# Patient Record
Sex: Female | Born: 1965 | Race: White | Hispanic: No | Smoking: Never smoker
Health system: Southern US, Community
[De-identification: ages and names within clinical notes are randomized; demographics above are authoritative.]

## PROBLEM LIST (undated history)

## (undated) DIAGNOSIS — B001 Herpesviral vesicular dermatitis: Secondary | ICD-10-CM

## (undated) DIAGNOSIS — R197 Diarrhea, unspecified: Secondary | ICD-10-CM

## (undated) DIAGNOSIS — J329 Chronic sinusitis, unspecified: Secondary | ICD-10-CM

## (undated) DIAGNOSIS — Z8489 Family history of other specified conditions: Secondary | ICD-10-CM

## (undated) DIAGNOSIS — R5383 Other fatigue: Secondary | ICD-10-CM

## (undated) DIAGNOSIS — R011 Cardiac murmur, unspecified: Secondary | ICD-10-CM

## (undated) DIAGNOSIS — R519 Headache, unspecified: Secondary | ICD-10-CM

## (undated) DIAGNOSIS — R5381 Other malaise: Secondary | ICD-10-CM

## (undated) DIAGNOSIS — R931 Abnormal findings on diagnostic imaging of heart and coronary circulation: Secondary | ICD-10-CM

## (undated) DIAGNOSIS — I351 Nonrheumatic aortic (valve) insufficiency: Secondary | ICD-10-CM

## (undated) DIAGNOSIS — J019 Acute sinusitis, unspecified: Secondary | ICD-10-CM

## (undated) DIAGNOSIS — I341 Nonrheumatic mitral (valve) prolapse: Secondary | ICD-10-CM

## (undated) DIAGNOSIS — L309 Dermatitis, unspecified: Secondary | ICD-10-CM

## (undated) HISTORY — DX: Diarrhea, unspecified: R19.7

## (undated) HISTORY — DX: Herpesviral vesicular dermatitis: B00.1

## (undated) HISTORY — DX: Cardiac murmur, unspecified: R01.1

## (undated) HISTORY — DX: Chronic sinusitis, unspecified: J32.9

## (undated) HISTORY — DX: Other malaise: R53.81

## (undated) HISTORY — DX: Headache, unspecified: R51.9

## (undated) HISTORY — DX: Other fatigue: R53.83

## (undated) HISTORY — DX: Nonrheumatic mitral (valve) prolapse: I34.1

## (undated) HISTORY — DX: Dermatitis, unspecified: L30.9

## (undated) HISTORY — DX: Acute sinusitis, unspecified: J01.90

---
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
## 2020-06-22 ENCOUNTER — Telehealth: Payer: Self-pay

## 2020-06-22 ENCOUNTER — Ambulatory Visit
Admission: RE | Admit: 2020-06-22 | Discharge: 2020-06-22 | Disposition: A | Discharge status: Home / Self Care | Payer: Self-pay | Source: Ambulatory Visit | Attending: Cardiovascular Disease | Admitting: Cardiovascular Disease

## 2020-06-22 DIAGNOSIS — I340 Nonrheumatic mitral (valve) insufficiency: Secondary | ICD-10-CM

## 2020-06-22 NOTE — Telephone Encounter (Signed)
Per Dr. [name] and Dr. Cornelius Moras, called patient to discuss MR consult. [place] and would like consults and CTs done 12/15 if possible. She understands this will try to be arranged and she will be called early next week to confirm.   Will request records ASAP from: Dr. Moise Boring [facility]) Dr. Delphina Cahill [facility])

## 2020-06-26 NOTE — Telephone Encounter (Signed)
Spoke with patient and confirmed she will have appointments on 12/15. 0900: Consult with Dr. [name] 1045: CT scans 1600: Consult with Dr. Cornelius Moras  MyChart code sent to patient and will send message with appointment/CT information when she signs up. She was grateful for call and agrees with plan.

## 2020-07-03 ENCOUNTER — Telehealth (HOSPITAL_COMMUNITY): Payer: Self-pay | Admitting: Emergency Medicine

## 2020-07-03 NOTE — Telephone Encounter (Signed)
Reaching out to patient to offer assistance regarding upcoming cardiac imaging study; pt verbalizes understanding of appt date/time, parking situation and where to check in, pre-test NPO status and medications ordered, and verified current allergies; name and call back number provided for further questions should they arise Alexa Alexandria RN Navigator Cardiac Imaging [name] [phone] office [phone] cell   Pt will be seeing Alexa Pennington in [facility] [address] office at 9a; Alexa Pennington to give metop in office if HR >55bpm.  Then patient seeing Alexa Pennington in [facility] office at 4pm.   Alexa Pennington

## 2020-07-05 ENCOUNTER — Other Ambulatory Visit: Payer: Self-pay | Admitting: *Deleted

## 2020-07-05 ENCOUNTER — Institutional Professional Consult (permissible substitution): Payer: Federal, State, Local not specified - PPO | Admitting: Thoracic Surgery (Cardiothoracic Vascular Surgery)

## 2020-07-05 ENCOUNTER — Other Ambulatory Visit: Payer: Self-pay

## 2020-07-05 ENCOUNTER — Ambulatory Visit: Payer: Federal, State, Local not specified - PPO | Admitting: Cardiovascular Disease

## 2020-07-05 ENCOUNTER — Encounter: Payer: Self-pay | Admitting: Thoracic Surgery (Cardiothoracic Vascular Surgery)

## 2020-07-05 ENCOUNTER — Ambulatory Visit (HOSPITAL_COMMUNITY)
Admission: RE | Admit: 2020-07-05 | Discharge: 2020-07-05 | Disposition: A | Discharge status: Home / Self Care | Payer: Federal, State, Local not specified - PPO | Source: Ambulatory Visit | Attending: Cardiovascular Disease | Admitting: Cardiovascular Disease

## 2020-07-05 ENCOUNTER — Encounter: Payer: Self-pay | Admitting: Cardiovascular Disease

## 2020-07-05 VITALS — BP 110/76 | HR 75 | Ht 67.0 in | Wt 168.2 lb

## 2020-07-05 VITALS — BP 114/74 | HR 70 | Resp 18 | Ht 67.0 in | Wt 168.0 lb

## 2020-07-05 DIAGNOSIS — I340 Nonrheumatic mitral (valve) insufficiency: Secondary | ICD-10-CM

## 2020-07-05 MED ORDER — NITROGLYCERIN 0.4 MG SL SUBL
0.8000 mg | SUBLINGUAL_TABLET | Freq: Once | SUBLINGUAL | Status: AC
  Administered 2020-07-05: 0.8 mg via SUBLINGUAL

## 2020-07-05 MED ORDER — NITROGLYCERIN 0.4 MG SL SUBL
SUBLINGUAL_TABLET | SUBLINGUAL | Status: AC
  Filled 2020-07-05: qty 2

## 2020-07-05 MED ORDER — METOPROLOL TARTRATE 12.5 MG HALF TABLET: 100.0000 mg | ORAL_TABLET | Freq: Once | ORAL | Status: DC

## 2020-07-05 MED ORDER — IOHEXOL 350 MG/ML SOLN
100.0000 mL | Freq: Once | INTRAVENOUS | Status: AC | PRN
  Administered 2020-07-05: 100 mL via INTRAVENOUS

## 2020-07-05 NOTE — Patient Instructions (Signed)
Continue all previous medications without any changes at this time  Make sure to bring all of your medications with you when you come for your Pre-Admission Testing appointment at [facility] Short-Stay Department.  Have nothing to eat or drink after midnight the night before surgery.  On the morning of surgery do not take any medications.   At your appointment for Pre-Admission Testing at the [facility] Short-Stay Department you will be asked to sign permission forms for your upcoming surgery.  By definition your signature on these forms implies that you and/or your designee provide full informed consent for your planned surgical procedure(s), that alternative treatment options have been discussed, that you understand and accept any and all potential risks, and that you have some understanding of what to expect for your post-operative convalescence.  For any major cardiac surgical procedure potential operative risks include but are not limited to at least some risk of death, stroke or other neurologic complication, myocardial infarction, congestive heart failure, respiratory failure, renal failure, bleeding requiring blood transfusion and/or reexploration, irregular heart rhythm, heart block or bradycardia requiring permanent pacemaker, aortic dissection or other major vascular complication, pneumonia, pericardial effusion, pleural effusion, wound infection, pulmonary embolus or other thromboembolic complication, chronic pain, or other complications related to the specific procedure(s) performed.  Please call to schedule a follow-up appointment in our office prior to surgery if you have any unresolved questions about your planned surgical procedure, the associated risks, alternative treatment options, and/or expectations for your post-operative recovery.

## 2020-07-05 NOTE — Progress Notes (Signed)
[address]       [place] [address]             [place] REPORT  Referring Provider is Sherren Mocha, MD Primary Cardiologist is Velta Addison, Annie Main, MD Primary Care Physician is Paulita Fujita, MD  Chief Complaint  Patient presents with  . Mitral Regurgitation    Initial surgical consult, Cta today    HPI:  Patient is 54 year old female with longstanding history of mitral valve prolapse and mitral regurgitation who has been referred for surgical consultation.  Patient states that she was first told that she had a heart murmur by her pediatrician when she was in junior high school.  Several female family members have had the diagnosis of mitral valve prolapse as well.  She initially had several serial echocardiograms studies as well as Holter monitor for evaluation of palpitations.  She remained clinically stable and went for nearly 20 years without further formal cardiac follow-up.  More recently she has been followed with routine echocardiography.  Transthoracic echocardiograms have reportedly demonstrated the presence of normal left ventricular function with moderately severe and severe mitral regurgitation.  She was seen in follow-up by her cardiologist in [place] and underwent transesophageal echocardiogram which demonstrated myxomatous degenerative disease of the mitral valve with a large flail segment of the posterior leaflet and obvious ruptured chordae tendinae.  There was severe mitral regurgitation and normal left ventricular systolic function.  The patient discussed these findings with her cousin, Dr. Darlina Guys, and her transesophageal echocardiogram was forwarded to our multidisciplinary heart valve team for review.  The patient was seen in consultation by Dr. Burt Knack earlier today and subsequently underwent coronary CT angiography and was referred for surgical consultation.  Patient is  divorced and lives alone in [place], [place].  She has 2 adult children 1 of whom just graduated from [employer] and the other remains in college and runs track at [employer].  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  This primarily involves working from home.  The patient has remained physically active and entirely functionally independent throughout her adult life.  A year and a half ago she sustained an injury to her knee and that limited how much exercise she had been getting.  However, more recently she has been back walking on a regular basis.  She denies any significant physical limitations although she states that she "feels out of shape".  She does note that if she walks up a flight of stairs she seems to be a little bit short of breath by the time she gets to the top step, but this is mild and does not seem to limit her at all.  She otherwise has no symptoms of exertional shortness of breath or chest discomfort.  She has never had resting shortness of breath, PND, orthopnea, or lower extremity edema.  She states that she has had some palpitations in the past but she is no longer aware of them.  She has never had any dizziness or syncope.  Past Medical History:  Diagnosis Date  . Acute sinusitis   . Dermatitis   . Diarrhea   . Facial pain   . Fever blister   . Headache   . Heart murmur   . Malaise and fatigue   . Mitral valve prolapse   . Sinus infection     No past  surgical history on file.  Family History  Problem Relation Age of Onset  . CAD Neg Hx     Social History   Socioeconomic History  . Marital status: Divorced    Spouse name: Not on file  . Number of children: Not on file  . Years of education: Not on file  . Highest education level: Not on file  Occupational History  . Not on file  Tobacco Use  . Smoking status: Never Smoker  . Smokeless tobacco: Never Used  Substance and Sexual  Activity  . Alcohol use: Yes    Comment: social  . Drug use: Never  . Sexual activity: Not on file  Other Topics Concern  . Not on file  Social History Narrative  . Not on file   Social Determinants of Health   Financial Resource Strain: Not on file  Food Insecurity: Not on file  Transportation Needs: Not on file  Physical Activity: Not on file  Stress: Not on file  Social Connections: Not on file  Intimate Partner Violence: Not on file    Current Outpatient Medications  Medication Sig Dispense Refill  . levonorgestrel-ethinyl estradiol (NORDETTE) 0.15-30 MG-MCG tablet Take 1 tablet by mouth daily.    Marland Kitchen acyclovir (ZOVIRAX) 400 MG tablet Take 400 mg by mouth 5 (five) times daily. (Patient not taking: Reported on 07/05/2020)    . butalbital-acetaminophen-caffeine (FIORICET) 50-325-40 MG tablet Take by mouth 2 (two) times daily as needed for headache. (Patient not taking: Reported on 07/05/2020)    . cetirizine (ZYRTEC) 10 MG tablet Take 10 mg by mouth daily. (Patient not taking: Reported on 07/05/2020)    . ciclopirox (LOPROX) 0.77 % cream Apply topically 2 (two) times daily. (Patient not taking: Reported on 07/05/2020)     Current Facility-Administered Medications  Medication Dose Route Frequency Provider Last Rate Last Admin  . metoprolol tartrate (LOPRESSOR) tablet 100 mg  100 mg Oral Once Sherren Mocha, MD        Allergies  Allergen Reactions  . Levaquin [Levofloxacin]       Review of Systems:   General:  normal appetite, normal energy, no weight gain, no weight loss, no fever  Cardiac:  no chest pain with exertion, no chest pain at rest, possibly mild SOB with more strenuous exertion, no resting SOB, no PND, no orthopnea, no palpitations, no arrhythmia, no atrial fibrillation, no LE edema, no dizzy spells, no syncope  Respiratory:  no shortness of breath, no home oxygen, no productive cough, no dry cough, no bronchitis, no wheezing, no hemoptysis, no asthma, no pain  with inspiration or cough, no sleep apnea, no CPAP at night  GI:   no difficulty swallowing, no reflux, no frequent heartburn, no hiatal hernia, no abdominal pain, no constipation, no diarrhea, no hematochezia, no hematemesis, no melena  GU:   no dysuria,  no frequency, no urinary tract infection, no hematuria, no kidney stones, no kidney disease  Vascular:  no pain suggestive of claudication, no pain in feet, no leg cramps, no varicose veins, no DVT, no non-healing foot ulcer  Neuro:   no stroke, no TIA's, no seizures, no headaches, no temporary blindness one eye,  no slurred speech, no peripheral neuropathy, no chronic pain, no instability of gait, no memory/cognitive dysfunction  Musculoskeletal: no arthritis, no joint swelling, no myalgias, no difficulty walking, normal mobility   Skin:   no rash, no itching, no skin infections, no pressure sores or ulcerations  Psych:   no anxiety, no depression,  no nervousness, no unusual recent stress  Eyes:   no blurry vision, no floaters, no recent vision changes, + wears glasses or contacts  ENT:   no hearing loss, no loose or painful teeth, no dentures, last saw dentist 2 weeks ago  Hematologic:  no easy bruising, no abnormal bleeding, no clotting disorder, no frequent epistaxis  Endocrine:  no diabetes, does not check CBG's at home     Physical Exam:   BP 114/74 (BP Location: Right Arm, Patient Position: Sitting)   Pulse 70   Resp 18   Ht _0  (1.702 m)   Wt 168 lb (76.2 kg)   SpO2 98% Comment: RA with mask on  BMI 26.31 kg/m   General:   well-appearing  HEENT:  Unremarkable   Neck:   no JVD, no bruits, no adenopathy   Chest:   clear to auscultation, symmetrical breath sounds, no wheezes, no rhonchi   CV:   RRR, grade IV/VI holosystolic murmur heard all across the precordium, best at apex  Abdomen:  soft, non-tender, no masses   Extremities:  warm, well-perfused, pulses palpable, no LE edema  Rectal/GU  Deferred  Neuro:   Grossly  non-focal and symmetrical throughout  Skin:   Clean and dry, no rashes, no breakdown   Diagnostic Tests:  TRANSTHORACIC ECHOCARDIOGRAM  Echocardiogram: Final Impressions:  1. The left ventricular chamber size is moderately dilated.  2. No significant left ventricular hypertrophy.  3. Global left ventricular wall motion and contractility are within  normal limits and the estimated ejection fraction is approximately 65%.  4. The left atrium is moderately dilated.  5. Unable to rule out PFO by color doppler. Patient declined bubble  study at this time.  6. There is prolapse of the posterior mitral valve leaflet. There is  likely severe, very eccentric regurgitation. The regurgitant jet is  predominantly anteriorly directed. The flow patterns of the pulmonary  veins were consistent with systolic predominance making it more  moderate.  7. There is also mild to moderate tricuspid and mild pulmonic  regurgitation.  8. No pulmonary hypertension identified. Estimated RVSP is 84mHg.  9. There is no pericardial effusion.  10. The inferior vena cava appearance suggests normal right ventricle  filling pressures.   Electronically signed in [facility] bEU:MPNTIRWEAndrey SpearmanM.D. on  05/26/2020 17:55:41    TRANSESOPHAGEAL ECHOCARDIOGRAM  [facility] PROCEDURE NOTE    PROCEDURE: Transesophageal echocardiogram PRE-PROCEDURE DX: Valvular heart disease POST-PROCEDURE DX: Valvular Heart Disease  PRIMARY PHYSICIAN: VConnye Burkitt MD  ADDITIONAL PHYSICIANS: None  ANESTHESIA TYPE: MAC  EBL: None or negligible  LINES: None  DRAINS: None  SPECIMENS: None  IMPLANTS: None  COMPLICATIONS: None  BRIEF FINDINGS: mod to severe MR, prolapse is severe with likely ruptured chord.  VConnye Burkitt MD    Electronically signed by VVelta Addison SDennie Fetters, MD at 06/12/2020 9:22 AM EST      Cardiac CTA  MEDICATIONS: Sub lingual nitro. 472mand  lopressor 100 mg  TECHNIQUE: The patient was scanned on a [id number] scanner. Gantry rotation speed was 250 msecs. Collimation was .6 mm. A 100 kV prospective scan was triggered in the ascending thoracic aorta at 140 HU's Full mA was used between 35% and 75% of the R-R interval. Average HR during the scan was 55 bpm. The 3D data set was interpreted on a dedicated work station using MPR, MIP and VRT modes. A total of 80cc of  contrast was used.  FINDINGS: Non-cardiac: See separate report from [facility]. No significant findings on limited lung and soft tissue windows.  Calcium Score: No calcium noted in coronary arteries  Coronary Arteries: Right dominant with no anomalies  LM: Normal  LAD: Normal  IM: Normal  D1: Normal  Circumflex: Normal  OM1: Normal  OM2: Normal  RCA: Normal  PDA: Normal  PLA: Normal  IMPRESSION: 1. Calcium score 0  2.  Normal right dominant coronary arteries  3.  Normal aortic root 3.0 cm  Jenkins Rouge  Electronically Signed: By: Jenkins Rouge M.D. On: 07/05/2020 10:57    CT ANGIOGRAPHY CHEST, ABDOMEN AND PELVIS  TECHNIQUE: Limited Non-contrast CT of the chest was initially obtained.  Multidetector CT imaging through the chest, abdomen and pelvis was performed using the standard protocol during bolus administration of intravenous contrast. Multiplanar reconstructed images and MIPs were obtained and reviewed to evaluate the vascular anatomy.  CONTRAST:  169m OMNIPAQUE IOHEXOL 350 MG/ML SOLN  COMPARISON:  None.  FINDINGS: CTA CHEST FINDINGS  Cardiovascular: Heart size normal. No pericardial effusion. The RV is nondilated. Central pulmonary arteries unremarkable; the exam was not optimized for detection of pulmonary emboli. There is mild left atrial enlargement. Adequate contrast opacification of the thoracic aorta with no evidence of dissection, aneurysm, or stenosis.  There is classic 3-vessel brachiocephalic arch anatomy without proximal stenosis. No significant atheromatous plaque.  Mediastinum/Nodes: No mass or adenopathy.  Lungs/Pleura: No pleural effusion. No pneumothorax. Lungs are clear.  Musculoskeletal: Mild spondylitic changes in the visualized lower cervical and mid thoracic spine. No fracture or worrisome  Review of the MIP images confirms the above findings.  CTA ABDOMEN AND PELVIS FINDINGS  VASCULAR  Aorta: Normal caliber aorta without aneurysm, dissection, vasculitis or significant stenosis.  Celiac: Patent without evidence of aneurysm, dissection, vasculitis or significant stenosis.  SMA: Patent without evidence of aneurysm, dissection, vasculitis or significant stenosis.  Renals: Duplicated left, superior dominant, both patent. Single right, widely patent.  IMA: Patent without evidence of aneurysm, dissection, vasculitis or significant stenosis.  Inflow: Mild tortuosity of the iliac arterial system. No aneurysm, dissection, or significant atheromatous change.  Veins: Mildly dilated refluxing left ovarian vein. Patent portal vein and bilateral renal veins.  Review of the MIP images confirms the above findings.  NON-VASCULAR  Hepatobiliary: 0.6 and 1.1 cm low-attenuation lesions in hepatic segment 7 statistically most likely cysts in the absence of a history of primary carcinoma, but incompletely characterized. 1.1 Cm enhancing nonspecific lesion in segment 5 (Im349,Se6) , possibly FNH or adenoma but nonspecific. No biliary ductal dilatation. Gallbladder unremarkable.  Pancreas: Unremarkable. No pancreatic ductal dilatation or surrounding inflammatory changes.  Spleen: Normal in size without focal abnormality.  Adrenals/Urinary Tract: Adrenal glands are unremarkable. Kidneys are normal, without renal calculi, focal lesion, or hydronephrosis. Bladder is unremarkable.  Stomach/Bowel:  Stomach and small bowel are nondilated, unremarkable. Normal appendix. The colon is nondilated with a few scattered distal descending diverticula; no significant adjacent inflammatory change.  Lymphatic: No abdominal or pelvic adenopathy.  Reproductive: Degenerated 4.6 cm fibroid with coarse calcifications. No adnexal mass.  Other: No ascites.  No free air.  Musculoskeletal: Mild thoracolumbar scoliosis with spondylitic changes in the lower lumbar spine. No fracture or worrisome bone lesion.  Review of the MIP images confirms the above findings.  IMPRESSION: 1. Negative for aortic dissection, aneurysm, or significant atheromatous change. 2. Mild tortuosity of the iliac arterial system. 3. Degenerated 4.6 cm uterine fibroid. 4. Small hepatic segment 7  lesions, statistically most likely cysts in the absence of a history of primary carcinoma, but incompletely characterized. 5. 1.1cm enhancing lesion in segment 5, possibly FNH or adenoma but nonspecific. If clinically indicated, MR liver with contrast may be useful for further characterization.   Electronically Signed   By: Lucrezia Europe M.D.   On: 07/05/2020 11:34    Impression:  Patient has mitral valve prolapse with at least stage C and possibly very early stage D severe primary mitral regurgitation.  She remains essentially asymptomatic although she states that she has noticed that she gets a little bit short of breath when she walks up a flight of stairs.  She remains fairly active physically and walks on a regular basis and reports no other symptoms of exertional shortness of breath nor chest discomfort.  I personally reviewed images from the patient's recent transesophageal echocardiogram performed June 12, 2020 and CT angiogram performed earlier today.  Patient has myxomatous degenerative disease of the mitral valve with an obvious flail segment involving a portion of the middle scallop (P2) of the posterior  leaflet.  There is at least 1 ruptured primary chordae tendinae.  There is severe mitral regurgitation with an eccentric jet that courses around the anterior portion of the left atrium.  There is normal left ventricular size and systolic function.  The aortic valve appears normal.  Right ventricular size and function is normal.  There is trivial tricuspid regurgitation.  Gated coronary CT angiogram reveals normal coronary artery anatomy with no significant coronary artery disease and no calcium in the coronary arteries.  CT angiography of the chest, abdomen and pelvis revealed normal thoracic and abdominal aorta with normal iliac arteries and pelvic vasculature.  Options include continued close observation on medical therapy versus elective mitral valve repair.  Based upon review of the patient's recent transesophageal echocardiogram I feel that her valve should be repairable with greater than 98% confidence and anticipated surgical risk of mortality below 1%.  The patient appears to be a good candidate for minimally invasive approach for surgery.   Plan:  The patient and both her parents were counseled at length regarding her diagnosis of severe primary mitral regurgitation.  We reviewed the results of their diagnostic tests including images from the most recent transesophageal echocardiogram.  We discussed the natural history of mitral regurgitation as well as alternative treatment strategies.  We discussed the impact of  age, current state of health, and any significant comorbid medical problems on clinical decision making.  We went on to discuss the indications, risks and potential benefits of mitral valve repair as well as the timing of surgical intervention.  The rationale for elective surgery has been explained, including a comparison between surgery and continued medical therapy with close follow-up.  The likelihood of successful and durable mitral valve repair has been discussed with particular  reference to the findings of the most recent echocardiogram.  Based upon these findings and previous experience, I have quoted a greater than 98 percent likelihood of successful valve repair with less than 1 percent risk of mortality or major morbidity.  Alternative surgical approaches have been discussed including a comparison between conventional sternotomy and minimally-invasive techniques.  The relative risks and benefits of each have been reviewed as they pertain to the patient's specific circumstances, and expectations for the patient's postoperative convalescence has been discussed.  The patient desires to proceed with surgery in the near future.  We tentatively plan for surgery on August 15, 2020.Marland Kitchen  The patient understands and accepts all potential risks of surgery including but not limited to risk of death, stroke or other neurologic complication, myocardial infarction, congestive heart failure, respiratory failure, renal failure, bleeding requiring transfusion and/or reexploration, arrhythmia, heart block or bradycardia requiring permanent pacemaker insertion, infection or other wound complications, pneumonia, pleural and/or pericardial effusion, pulmonary embolus, aortic dissection or other major vascular complication, or other immediate or delayed complications related to valve repair or replacement including but not limited to recurrent or persistent mitral regurgitation and/or mitral stenosis, LV outflow tract obstruction, aortic insufficiency, paravalvular leak, posterior AV groove disruption, structural valve deterioration and failure, thrombosis, embolization, or endocarditis.  Specific risks potentially related to the minimally-invasive approach were discussed at length, including but not limited to risk of conversion to full or partial sternotomy, aortic dissection or other major vascular complication, unilateral acute lung injury or pulmonary edema, phrenic nerve dysfunction or paralysis,  rib fracture, chronic pain, lung hernia, or lymphocele. All of her questions have been answered.    I spent in excess of 90 minutes during the conduct of this office consultation and >50% of this time involved direct face-to-face encounter with the patient for counseling and/or coordination of their care.    Valentina Gu. Roxy Manns, MD 07/05/2020 2:52 PM

## 2020-07-05 NOTE — Patient Instructions (Addendum)
Please proceed directly to your CT scans. [facility] (Entrance A). There is free valet parking if you'd like to utilize that service. Proceed down the long hallway once you have entered the building. Proceed past admitting and take the elevators or stairs at the end of the hall way to the first floor. Radiology check-in is right there. Please arrive between 10AM and 10:15AM for check-in.  Your appointment with Dr. Cornelius Moras is this afternoon at 4:00PM. Please arrive at 3:45PM for check-in. [facility] [address] [place] [address] [place],  [place]  [address] Phone: [phone]

## 2020-07-05 NOTE — Progress Notes (Signed)
Cardiology Office Note:    Date:  07/05/2020   ID:  Alexa GalloAllison Row, DOB 1965-12-11, MRN [id number]  PCP:  Patient, No Pcp Per  [facility] Cardiologist:  Tonny BollmanMichael Marshella Tello, MD  [facility] Electrophysiologist:  None   Referring MD: No ref. provider found   Chief Complaint  Patient presents with  . Mitral Valve Prolapse   History of Present Illness:    Alexa Pennington is a 54 y.o. female with a hx of longstanding mitral valve prolapse with mitral regurgitation.  Patient lives in [place] and she presents today for evaluation of severe mitral regurgitation.  The patient is Dr. Gibson RampMcalhany's cousin.  We have reviewed her case in our multidisciplinary heart valve meetings and the patient is scheduled to see Dr. Cornelius Moraswen later today for surgical evaluation.  The patient is here alone today. She was first told of having a heart murmur by her pediatrician when she was in [employer].  She had serial echo studies in the 1990's after being diagnosed with mitral valve prolapse. She then went from 1999 until 2020 without any cardiac imaging. She requested an echocardiogram last year and was diagnosed with moderate mitral regurgitation.  Since that echo was obtained, she has had serial echo studies every 6 months.  Her more recent echo studies have demonstrated moderately severe and severe mitral regurgitation.  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  She presents today for further evaluation.  The patient lives in [place].  She is divorced and has 2 grown children, a son who recently graduated from [employer] [place] and a daughter who runs cross-country at [employer].  The patient has had some limitation with the left knee problem over the last few years, but otherwise has a  very high functional capacity.  Her knee has improved and she is walking 2 miles on a regular basis with mild shortness of breath with going up a steep hill.  She otherwise has no symptoms at all.  She denies any recent heart palpitations, chest pain or pressure, orthopnea, PND, leg swelling, lightheadedness, or syncope.    Past Medical History:  Diagnosis Date  . Acute sinusitis   . Dermatitis   . Diarrhea   . Facial pain   . Fever blister   . Headache   . Heart murmur   . Malaise and fatigue   . Mitral valve prolapse   . Sinus infection     History reviewed. No pertinent surgical history.  Current Medications: Current Meds  Medication Sig  . acyclovir (ZOVIRAX) 400 MG tablet Take 400 mg by mouth 5 (five) times daily. (Patient not taking: Reported on 07/05/2020)  . butalbital-acetaminophen-caffeine (FIORICET) 50-325-40 MG tablet Take by mouth 2 (two) times daily as needed for headache. (Patient not taking: Reported on 07/05/2020)  . cetirizine (ZYRTEC) 10 MG tablet Take 10 mg by mouth daily. (Patient not taking: Reported on 07/05/2020)  . ciclopirox (LOPROX) 0.77 % cream Apply topically 2 (two) times daily. (Patient not taking: Reported on 07/05/2020)  . levonorgestrel-ethinyl estradiol (NORDETTE) 0.15-30 MG-MCG tablet Take 1 tablet by mouth daily.   Current Facility-Administered Medications for the 07/05/20 encounter (Office Visit) with Tonny Bollmanooper, Doyt Castellana, MD  Medication  . metoprolol tartrate (LOPRESSOR) tablet 100 mg     Allergies:   Levaquin [levofloxacin]   Social History  Socioeconomic History  . Marital status: Divorced    Spouse name: Not on file  . Number of children: Not on file  . Years of education: Not on file  . Highest education level: Not on file  Occupational History  . Not on file  Tobacco Use  . Smoking status: Never Smoker  . Smokeless tobacco: Never Used  Substance and Sexual Activity  . Alcohol use: Yes    Comment: social  . Drug use: Never  .  Sexual activity: Not on file  Other Topics Concern  . Not on file  Social History Narrative  . Not on file   Social Determinants of Health   Financial Resource Strain: Not on file  Food Insecurity: Not on file  Transportation Needs: Not on file  Physical Activity: Not on file  Stress: Not on file  Social Connections: Not on file     Family History: The patient's family history is negative for CAD.  ROS:   Please see the history of present illness.    All other systems reviewed and are negative.  EKGs/Labs/Other Studies Reviewed:    The following studies were reviewed today: TEE 06/21/20: Left Ventricle: The left ventricular chamber size is normal. Global left ventricular wall motion and contractility are within normal limits.   Left Atrium: The left atrial appendage appears normal. The intra-atrial septum appears normal.There is no patent foramen ovale or atrial septal defect demonstrated with color Doppler or agitated saline. Delayed appearance of saline contrast bubbles is noted in the left atrium indicating inter-pulmonary shunting.   Right Ventricle: The right ventricular chamber size and systolic function are within normal limits.   Right Atrium: The right atrium appears normal.   Aortic Valve: The aortic valve leaflet mobility appears normal. The aortic valve leaflets appear normal. There is no evidence of aortic regurgitation.   Mitral Valve: There is moderate to severe mitral regurgitation observed. The mitral regurgitant jet is eccentric. There is evidence of a flailed mitral valve leaflet.There is a 93mm gap between the leafleats seen on image 52 and the area of measures 0.5cm2.    Tricuspid Valve: Tricuspid valve leaflet mobility appears normal. The tricuspid valve leaflets are morphologically normal. There is trace tricuspid regurgitation present.   Pulmonic Valve: The pulmonic valve is not well visualized. There is no evidence of pulmonic  regurgitation.   Pericardium: There is no pericardial effusion.   Aorta: The aorta appears normal. There is no aortic plaque visualized in the ascending, descending or transverse arch.   Pulmonary Artery: The main pulmonary artery and proximal segments of the left and right branches appear normal appear normal.   Venous: The superior vena cava appears normal.The inferior vena cava appears normal. The pulmonary veins appear normal.   Additional Comments: There were no extracardiac structures visualized during this exam.   Final Impressions: 1. Global left ventricular wall motion and contractility are within normal limits. 2. The left atrial appendage appears normal. 3. The intra-atrial septum appears normal. There is no patent foramen ovale or atrial septal defect demonstrated with color Doppler or agitated saline. 4. Delayed appearance of saline contrast bubbles is noted in the left atrium indicating inter-pulmonary shunting. 5. There is moderate to severe mitral regurgitation observed. 6. The mitral regurgitant jet is eccentric. 7. There is evidence of a flailed mitral valve leaflet. There is a 40mm gap between the leafleats seen on image 52 and the area of measures 0.5cm2.  8. There is trace tricuspid regurgitation present.  EKG:  EKG is ordered today.  The ekg ordered today demonstrates NSr 75 bpm, within normal limits  Recent Labs: No results found for requested labs within last 8760 hours.  Recent Lipid Panel No results found for: CHOL, TRIG, HDL, CHOLHDL, VLDL, LDLCALC, LDLDIRECT   Risk Assessment/Calculations:     Physical Exam:    VS:  BP 110/76   Pulse 75   Ht 5\' 7"  (1.702 m)   Wt 168 lb 3.2 oz (76.3 kg)   SpO2 97%   BMI 26.34 kg/m     Wt Readings from Last 3 Encounters:  07/05/20 168 lb (76.2 kg)  07/05/20 168 lb 3.2 oz (76.3 kg)     GEN:  Well nourished, well developed in no acute distress HEENT: Normal NECK: No JVD; bilateral carotid bruits  consistent with transmitted murmur LYMPHATICS: No lymphadenopathy CARDIAC: RRR, 3/6 holosystolic murmur best heard at the apex but present throughout the precordium RESPIRATORY:  Clear to auscultation without rales, wheezing or rhonchi  ABDOMEN: Soft, non-tender, non-distended MUSCULOSKELETAL:  No edema; No deformity  SKIN: Warm and dry NEUROLOGIC:  Alert and oriented x 3 PSYCHIATRIC:  Normal affect   ASSESSMENT:    1. Nonrheumatic mitral valve regurgitation    PLAN:    In order of problems listed above:  1. The patient has severe, stage C, primary mitral regurgitation with longstanding history of mitral valve prolapse now with a flail segment of P2.  I have personally reviewed her transesophageal echo images which demonstrate a myxomatous mitral valve, flail P2 segment with severe eccentric anteriorly directed mitral regurgitation consistent with Carpentier type II mitral valve dysfunction.  She remains minimally symptomatic, with mild shortness of breath when walking up an incline or carrying luggage up a flight of stairs, likely with no significant functional limitation.  Her echo studies demonstrate evidence of left atrial enlargement and preserved LV systolic function.  We discussed considerations around mitral valve surgery at length today.  It is reasonable and guideline directed to consider mitral valve repair with the presence of severe primary mitral valve insufficiency as long as probability of successful repair is greater than 95% with a predicted mortality of less than 1%.  I suspect this will be the case in this healthy patient.  She will be referred for a coronary CTA study followed by formal surgical consultation with Dr. 07/07/20 who specializes in minimally invasive mitral valve surgery.  All of her questions are answered.  She will be administered 100 mg of oral metoprolol tartrate to improve image quality for her coronary CTA which follows this office visit.  Medication  Adjustments/Labs and Tests Ordered: Current medicines are reviewed at length with the patient today.  Concerns regarding medicines are outlined above.  Orders Placed This Encounter  Procedures  . EKG 12-Lead   Meds ordered this encounter  Medications  . metoprolol tartrate (LOPRESSOR) tablet 100 mg    Patient Instructions  Please proceed directly to your CT scans. [facility] (Entrance A). There is free valet parking if you'd like to utilize that service. Proceed down the long hallway once you have entered the building. Proceed past admitting and take the elevators or stairs at the end of the hall way to the first floor. Radiology check-in is right there. Please arrive between 10AM and 10:15AM for check-in.  Your appointment with Dr. [facility] is this afternoon at 4:00PM. Please arrive at 3:45PM for check-in. [facility] [address] [place],  [place]  [address] Phone: [phone]    Signed, Tonny Bollman, MD  07/05/2020 5:02 PM    [facility]

## 2020-07-06 ENCOUNTER — Other Ambulatory Visit: Payer: Self-pay | Admitting: *Deleted

## 2020-07-06 ENCOUNTER — Encounter: Payer: Self-pay | Admitting: *Deleted

## 2020-08-07 ENCOUNTER — Other Ambulatory Visit: Payer: Self-pay | Admitting: *Deleted

## 2020-08-08 ENCOUNTER — Encounter: Payer: Self-pay | Admitting: *Deleted

## 2020-08-08 ENCOUNTER — Other Ambulatory Visit: Payer: Self-pay | Admitting: *Deleted

## 2020-08-08 DIAGNOSIS — I340 Nonrheumatic mitral (valve) insufficiency: Secondary | ICD-10-CM

## 2020-08-14 ENCOUNTER — Other Ambulatory Visit (HOSPITAL_COMMUNITY): Payer: Federal, State, Local not specified - PPO

## 2020-08-14 ENCOUNTER — Encounter: Payer: Federal, State, Local not specified - PPO | Admitting: Thoracic Surgery (Cardiothoracic Vascular Surgery)

## 2020-08-14 ENCOUNTER — Ambulatory Visit (HOSPITAL_COMMUNITY): Payer: Federal, State, Local not specified - PPO

## 2020-08-15 ENCOUNTER — Encounter (HOSPITAL_COMMUNITY): Admission: RE | Payer: Self-pay | Source: Home / Self Care

## 2020-08-15 ENCOUNTER — Inpatient Hospital Stay (HOSPITAL_COMMUNITY)
Admission: RE | Admit: 2020-08-15 | Payer: Federal, State, Local not specified - PPO | Source: Home / Self Care | Admitting: Thoracic Surgery (Cardiothoracic Vascular Surgery)

## 2020-08-15 SURGERY — REPAIR, MITRAL VALVE, MINIMALLY INVASIVE
Anesthesia: General | Site: Chest | Laterality: Right

## 2020-08-30 ENCOUNTER — Encounter (HOSPITAL_COMMUNITY)
Admission: RE | Admit: 2020-08-30 | Discharge: 2020-08-30 | Disposition: A | Discharge status: Home / Self Care | Payer: Federal, State, Local not specified - PPO | Source: Ambulatory Visit | Attending: Thoracic Surgery (Cardiothoracic Vascular Surgery) | Admitting: Thoracic Surgery (Cardiothoracic Vascular Surgery)

## 2020-08-30 ENCOUNTER — Other Ambulatory Visit (HOSPITAL_COMMUNITY)
Admission: RE | Admit: 2020-08-30 | Discharge: 2020-08-30 | Disposition: A | Discharge status: Home / Self Care | Payer: Federal, State, Local not specified - PPO | Source: Ambulatory Visit | Attending: Thoracic Surgery (Cardiothoracic Vascular Surgery) | Admitting: Thoracic Surgery (Cardiothoracic Vascular Surgery)

## 2020-08-30 ENCOUNTER — Ambulatory Visit (HOSPITAL_COMMUNITY)
Admission: RE | Admit: 2020-08-30 | Discharge: 2020-08-30 | Disposition: A | Discharge status: Home / Self Care | Payer: Federal, State, Local not specified - PPO | Source: Ambulatory Visit | Attending: Thoracic Surgery (Cardiothoracic Vascular Surgery) | Admitting: Thoracic Surgery (Cardiothoracic Vascular Surgery)

## 2020-08-30 ENCOUNTER — Other Ambulatory Visit: Payer: Self-pay

## 2020-08-30 ENCOUNTER — Encounter: Payer: Self-pay | Admitting: Thoracic Surgery (Cardiothoracic Vascular Surgery)

## 2020-08-30 ENCOUNTER — Ambulatory Visit (INDEPENDENT_AMBULATORY_CARE_PROVIDER_SITE_OTHER): Payer: Federal, State, Local not specified - PPO | Admitting: Thoracic Surgery (Cardiothoracic Vascular Surgery)

## 2020-08-30 ENCOUNTER — Encounter (HOSPITAL_COMMUNITY): Payer: Self-pay

## 2020-08-30 DIAGNOSIS — Z006 Encounter for examination for normal comparison and control in clinical research program: Secondary | ICD-10-CM | POA: Diagnosis not present

## 2020-08-30 DIAGNOSIS — D62 Acute posthemorrhagic anemia: Secondary | ICD-10-CM | POA: Diagnosis not present

## 2020-08-30 DIAGNOSIS — I340 Nonrheumatic mitral (valve) insufficiency: Secondary | ICD-10-CM

## 2020-08-30 DIAGNOSIS — Z01818 Encounter for other preprocedural examination: Secondary | ICD-10-CM

## 2020-08-30 DIAGNOSIS — Z20822 Contact with and (suspected) exposure to covid-19: Secondary | ICD-10-CM | POA: Diagnosis present

## 2020-08-30 DIAGNOSIS — I511 Rupture of chordae tendineae, not elsewhere classified: Secondary | ICD-10-CM | POA: Diagnosis not present

## 2020-08-30 DIAGNOSIS — Z01810 Encounter for preprocedural cardiovascular examination: Secondary | ICD-10-CM

## 2020-08-30 HISTORY — DX: Family history of other specified conditions: Z84.89

## 2020-08-30 LAB — URINALYSIS, MICROSCOPIC (REFLEX)

## 2020-08-30 LAB — URINALYSIS, ROUTINE W REFLEX MICROSCOPIC
Bilirubin Urine: NEGATIVE
Glucose, UA: NEGATIVE mg/dL
Ketones, ur: NEGATIVE mg/dL
Leukocytes,Ua: NEGATIVE
Nitrite: NEGATIVE
Protein, ur: NEGATIVE mg/dL
Specific Gravity, Urine: 1.01 (ref 1.005–1.030)
pH: 5.5 (ref 5.0–8.0)

## 2020-08-30 LAB — TYPE AND SCREEN
ABO/RH(D): A POS
Antibody Screen: NEGATIVE

## 2020-08-30 LAB — CBC
HCT: 35.4 % — ABNORMAL LOW (ref 36.0–46.0)
Hemoglobin: 12.2 g/dL (ref 12.0–15.0)
MCH: 32.3 pg (ref 26.0–34.0)
MCHC: 34.5 g/dL (ref 30.0–36.0)
MCV: 93.7 fL (ref 80.0–100.0)
Platelets: 234 10*3/uL (ref 150–400)
RBC: 3.78 MIL/uL — ABNORMAL LOW (ref 3.87–5.11)
RDW: 12.3 % (ref 11.5–15.5)
WBC: 6.3 10*3/uL (ref 4.0–10.5)
nRBC: 0 % (ref 0.0–0.2)

## 2020-08-30 LAB — BLOOD GAS, ARTERIAL
Acid-base deficit: 2.8 mmol/L — ABNORMAL HIGH (ref 0.0–2.0)
Bicarbonate: 21.3 mmol/L (ref 20.0–28.0)
Drawn by: 58793
FIO2: 21
O2 Saturation: 97.7 %
Patient temperature: 37
pCO2 arterial: 35.4 mmHg (ref 32.0–48.0)
pH, Arterial: 7.396 (ref 7.350–7.450)
pO2, Arterial: 100 mmHg (ref 83.0–108.0)

## 2020-08-30 LAB — PROTIME-INR
INR: 1 (ref 0.8–1.2)
Prothrombin Time: 12.3 seconds (ref 11.4–15.2)

## 2020-08-30 LAB — COMPREHENSIVE METABOLIC PANEL
ALT: 14 U/L (ref 0–44)
AST: 18 U/L (ref 15–41)
Albumin: 3.8 g/dL (ref 3.5–5.0)
Alkaline Phosphatase: 37 U/L — ABNORMAL LOW (ref 38–126)
Anion gap: 10 (ref 5–15)
BUN: 9 mg/dL (ref 6–20)
CO2: 20 mmol/L — ABNORMAL LOW (ref 22–32)
Calcium: 8.6 mg/dL — ABNORMAL LOW (ref 8.9–10.3)
Chloride: 109 mmol/L (ref 98–111)
Creatinine, Ser: 0.72 mg/dL (ref 0.44–1.00)
GFR, Estimated: >60 mL/min (ref >60)
Glucose, Bld: 93 mg/dL (ref 70–99)
Potassium: 3.8 mmol/L (ref 3.5–5.1)
Sodium: 139 mmol/L (ref 135–145)
Total Bilirubin: 0.7 mg/dL (ref 0.3–1.2)
Total Protein: 6.5 g/dL (ref 6.5–8.1)

## 2020-08-30 LAB — SURGICAL PCR SCREEN
MRSA, PCR: NEGATIVE
Staphylococcus aureus: NEGATIVE

## 2020-08-30 LAB — HEMOGLOBIN A1C
Hgb A1c MFr Bld: 5.1 % (ref 4.8–5.6)
Mean Plasma Glucose: 99.67 mg/dL

## 2020-08-30 LAB — APTT: aPTT: 31 seconds (ref 24–36)

## 2020-08-30 LAB — SARS CORONAVIRUS 2 (TAT 6-24 HRS): SARS Coronavirus 2: NEGATIVE

## 2020-08-30 MED ORDER — INSULIN REGULAR(HUMAN) IN NACL 100-0.9 UT/100ML-% IV SOLN
INTRAVENOUS | Status: AC
  Administered 2020-08-31: .9 [IU]/h via INTRAVENOUS
  Filled 2020-08-30: qty 100

## 2020-08-30 MED ORDER — PHENYLEPHRINE HCL-NACL 20-0.9 MG/250ML-% IV SOLN
30.0000 ug/min | INTRAVENOUS | Status: AC
  Administered 2020-08-31: 15 ug/min via INTRAVENOUS
  Filled 2020-08-30: qty 250

## 2020-08-30 MED ORDER — TRANEXAMIC ACID (OHS) PUMP PRIME SOLUTION
2.0000 mg/kg | INTRAVENOUS | Status: DC
  Filled 2020-08-30: qty 1.53

## 2020-08-30 MED ORDER — MILRINONE LACTATE IN DEXTROSE 20-5 MG/100ML-% IV SOLN
0.3000 ug/kg/min | INTRAVENOUS | Status: DC
  Filled 2020-08-30: qty 100

## 2020-08-30 MED ORDER — NOREPINEPHRINE 4 MG/250ML-% IV SOLN
0.0000 ug/min | INTRAVENOUS | Status: DC
  Filled 2020-08-30: qty 250

## 2020-08-30 MED ORDER — VANCOMYCIN HCL 1000 MG IV SOLR
INTRAVENOUS | Status: DC
  Filled 2020-08-30: qty 1000

## 2020-08-30 MED ORDER — GLUTARALDEHYDE 0.625% SOAKING SOLUTION
TOPICAL | Status: DC
  Filled 2020-08-30: qty 50

## 2020-08-30 MED ORDER — SODIUM CHLORIDE 0.9 % IV SOLN
750.0000 mg | INTRAVENOUS | Status: AC
  Administered 2020-08-31: 750 mg via INTRAVENOUS
  Filled 2020-08-30: qty 750

## 2020-08-30 MED ORDER — SODIUM CHLORIDE 0.9 % IV SOLN
INTRAVENOUS | Status: DC
  Filled 2020-08-30: qty 30

## 2020-08-30 MED ORDER — EPINEPHRINE HCL 5 MG/250ML IV SOLN IN NS
0.0000 ug/min | INTRAVENOUS | Status: DC
  Filled 2020-08-30: qty 250

## 2020-08-30 MED ORDER — PLASMA-LYTE 148 IV SOLN
INTRAVENOUS | Status: DC
  Filled 2020-08-30: qty 2.5

## 2020-08-30 MED ORDER — POTASSIUM CHLORIDE 2 MEQ/ML IV SOLN
80.0000 meq | INTRAVENOUS | Status: DC
  Filled 2020-08-30: qty 40

## 2020-08-30 MED ORDER — TRANEXAMIC ACID 1000 MG/10ML IV SOLN
1.5000 mg/kg/h | INTRAVENOUS | Status: AC
  Administered 2020-08-31: 1.5 mg/kg/h via INTRAVENOUS
  Filled 2020-08-30: qty 25

## 2020-08-30 MED ORDER — TRANEXAMIC ACID (OHS) BOLUS VIA INFUSION
15.0000 mg/kg | INTRAVENOUS | Status: AC
  Administered 2020-08-31: 1144.5 mg via INTRAVENOUS
  Filled 2020-08-30: qty 1145

## 2020-08-30 MED ORDER — NITROGLYCERIN IN D5W 200-5 MCG/ML-% IV SOLN
2.0000 ug/min | INTRAVENOUS | Status: DC
  Filled 2020-08-30: qty 250

## 2020-08-30 MED ORDER — VANCOMYCIN HCL 1250 MG/250ML IV SOLN
1250.0000 mg | INTRAVENOUS | Status: AC
  Administered 2020-08-31: 1250 mg via INTRAVENOUS
  Filled 2020-08-30: qty 250

## 2020-08-30 MED ORDER — SODIUM CHLORIDE 0.9 % IV SOLN
1.5000 g | INTRAVENOUS | Status: AC
  Administered 2020-08-31: 1.5 g via INTRAVENOUS
  Filled 2020-08-30: qty 1.5

## 2020-08-30 MED ORDER — MANNITOL 20 % IV SOLN
Freq: Once | INTRAVENOUS | Status: DC
  Filled 2020-08-30: qty 13

## 2020-08-30 MED ORDER — DEXMEDETOMIDINE HCL IN NACL 400 MCG/100ML IV SOLN
0.1000 ug/kg/h | INTRAVENOUS | Status: AC
  Administered 2020-08-31: .2 ug/kg/h via INTRAVENOUS
  Filled 2020-08-30: qty 100

## 2020-08-30 NOTE — Progress Notes (Signed)
PCP - d Cardiologist - Dr. Tonny Bollman  PPM/ICD - denies  Chest x-ray - 08/30/2020 EKG - 08/30/2020 Stress Test - denies ECHO - 05/2020 (C.E.) Cardiac Cath - denies  Sleep Study - denies CPAP - N/A  DM: denies  Blood Thinner Instructions: N/A Aspirin Instructions: N/A  ERAS Protcol - No  COVID TEST- 08/30/2020 test results pending, patient verbalized understanding of self-quarantine instructions.  Anesthesia review: YES, cardiac hx  Patient denies shortness of breath, fever, cough and chest pain at PAT appointment  All instructions explained to the patient, with a verbal understanding of the material. Patient agrees to go over the instructions while at home for a better understanding. Patient also instructed to self quarantine after being tested for COVID-19. The opportunity to ask questions was provided.

## 2020-08-30 NOTE — Progress Notes (Signed)
Dr. Laneta Simmers on call for [facility] made aware of UA +bacteria. Stated he would let Dr. Cornelius Moras know. No new orders at this time.

## 2020-08-30 NOTE — Progress Notes (Signed)
[address]       [name] [address]             [phone]     CARDIOTHORACIC SURGERY OFFICE NOTE  Referring Provider is Tonny Bollman, MD Primary Cardiologist is Clovis Pu, Jeannett Senior, MD PCP is Moise Boring, MD   HPI:  Patient is 55 year old female with longstanding history of mitral valve prolapse and mitral regurgitation who returns for final preoperative visit prior to elective mitral valve repair scheduled for August 31, 2020.  She was originally seen in consultation on July 05, 2020.  She reports no new problems or complaints over the past 2 months.  She specifically denies any significant exertional shortness of breath, chest discomfort, fever, productive cough.  She has not recently been exposed to any persons with known or suspected COVID-19 infection.   Current Outpatient Medications  Medication Sig Dispense Refill  . ibuprofen (ADVIL) 200 MG tablet Take 400 mg by mouth every 6 (six) hours as needed for mild pain or moderate pain.    Marland Kitchen levonorgestrel-ethinyl estradiol (NORDETTE) 0.15-30 MG-MCG tablet Take 1 tablet by mouth daily.     Current Facility-Administered Medications  Medication Dose Route Frequency Provider Last Rate Last Admin  . metoprolol tartrate (LOPRESSOR) tablet 100 mg  100 mg Oral Once Tonny Bollman, MD       Facility-Administered Medications Ordered in Other Visits  Medication Dose Route Frequency Provider Last Rate Last Admin  . [START ON 08/31/2020] cefUROXime (ZINACEF) 1.5 g in sodium chloride 0.9 % 100 mL IVPB  1.5 g Intravenous To OR Purcell Nails, MD      . Melene Muller ON 08/31/2020] cefUROXime (ZINACEF) 750 mg in sodium chloride 0.9 % 100 mL IVPB  750 mg Intravenous To OR Purcell Nails, MD      . Melene Muller ON 08/31/2020] dexmedetomidine (PRECEDEX) 400 MCG/100ML (4 mcg/mL) infusion  0.1-0.7 mcg/kg/hr Intravenous To OR Purcell Nails, MD      . Melene Muller ON 08/31/2020] EPINEPHrine (ADRENALIN) 4 mg in NS 250 mL (0.016  mg/mL) premix infusion  0-10 mcg/min Intravenous To OR Purcell Nails, MD      . Melene Muller ON 08/31/2020] glutaraldehyde 0.625% cardiac soaking solution   Topical On Call to OR Purcell Nails, MD      . Melene Muller ON 08/31/2020] heparin 30,000 units/NS 1000 mL solution for CELLSAVER   Other To OR Purcell Nails, MD      . Melene Muller ON 08/31/2020] heparin sodium (porcine) 2,500 Units, papaverine 30 mg in electrolyte-148 (PLASMALYTE-148) 500 mL irrigation   Irrigation To OR Purcell Nails, MD      . Melene Muller ON 08/31/2020] insulin regular, human (MYXREDLIN) 100 units/ 100 mL infusion   Intravenous To OR Purcell Nails, MD      . Maryagnes Amos Blood Cardioplegia vial (lidocaine/magnesium/mannitol 3.54T-6Y-5.6L)   Intracoronary Once Purcell Nails, MD      . Melene Muller ON 08/31/2020] milrinone (PRIMACOR) 20 MG/100 ML (0.2 mg/mL) infusion  0.3 mcg/kg/min Intravenous To OR Purcell Nails, MD      . Melene Muller ON 08/31/2020] nitroGLYCERIN 50 mg in dextrose 5 % 250 mL (0.2 mg/mL) infusion  2-200 mcg/min Intravenous To OR Purcell Nails, MD      . Melene Muller ON 08/31/2020] norepinephrine (LEVOPHED) 4mg  in premix infusion  0-40 mcg/min Intravenous To OR , MD      . Purcell Nails ON 08/31/2020] phenylephrine (NEOSYNEPHRINE) 20-0.9 MG/250ML-% infusion  30-200 mcg/min Intravenous  To OR Purcell Nails, MD      . Melene Muller ON 08/31/2020] potassium chloride injection 80 mEq  80 mEq Other To OR Purcell Nails, MD      . Melene Muller ON 08/31/2020] tranexamic acid (CYKLOKAPRON) 2,500 mg in sodium chloride 0.9 % 250 mL (10 mg/mL) infusion  1.5 mg/kg/hr Intravenous To OR Purcell Nails, MD      . Melene Muller ON 08/31/2020] tranexamic acid (CYKLOKAPRON) bolus via infusion - over 30 minutes 1,144.5 mg  15 mg/kg Intravenous To OR Purcell Nails, MD      . Melene Muller ON 08/31/2020] tranexamic acid (CYKLOKAPRON) pump prime solution 153 mg  2 mg/kg Intracatheter To OR Purcell Nails, MD      . Melene Muller ON 08/31/2020] vancomycin (VANCOCIN) 1,000  mg in sodium chloride 0.9 % 1,000 mL irrigation   Irrigation To OR Purcell Nails, MD      . Melene Muller ON 08/31/2020] vancomycin (VANCOREADY) IVPB 1250 mg/250 mL  1,250 mg Intravenous To OR Purcell Nails, MD          Physical Exam:   There were no vitals taken for this visit.  General:  Well-appearing  Chest:   Clear to auscultation  CV:   Regular rate and rhythm with prominent systolic murmur  Incisions:  n/a  Abdomen:  Soft nontender  Extremities:  Warm and well-perfused  Diagnostic Tests:  n/a   Impression:  Patient has mitral valve prolapse with at least stage C and possibly very early stage D severe primary mitral regurgitation.  She remains essentially asymptomatic although she states that she has noticed that she gets a little bit short of breath when she walks up a flight of stairs.  She remains fairly active physically and walks on a regular basis and reports no other symptoms of exertional shortness of breath nor chest discomfort.  I personally reviewed images from the patient's recent transesophageal echocardiogram performed June 12, 2020 and CT angiogram performed earlier today.  Patient has myxomatous degenerative disease of the mitral valve with an obvious flail segment involving a portion of the middle scallop (P2) of the posterior leaflet.  There is at least 1 ruptured primary chordae tendinae.  There is severe mitral regurgitation with an eccentric jet that courses around the anterior portion of the left atrium.  There is normal left ventricular size and systolic function.  The aortic valve appears normal.  Right ventricular size and function is normal.  There is trivial tricuspid regurgitation.  Gated coronary CT angiogram reveals normal coronary artery anatomy with no significant coronary artery disease and no calcium in the coronary arteries.  CT angiography of the chest, abdomen and pelvis revealed normal thoracic and abdominal aorta with normal iliac arteries and  pelvic vasculature.  Options include continued close observation on medical therapy versus elective mitral valve repair.  Based upon review of the patient's recent transesophageal echocardiogram I feel that her valve should be repairable with greater than 98% confidence and anticipated surgical risk of mortality below 1%.  The patient appears to be a good candidate for minimally invasive approach for surgery.   Plan:  The patient was again counseled at length in the preoperative holding area regarding the indications, risks and potential benefits of mitral valve repair as well as the timing of surgical intervention.  The rationale for elective surgery has been explained, including a comparison between surgery and continued medical therapy with close follow-up.  The likelihood of successful and durable mitral valve repair  has been discussed with particular reference to the findings of the most recent echocardiogram.  Based upon these findings and previous experience, I have quoted a greater than 98 percent likelihood of successful valve repair with less than 1 percent risk of mortality or major morbidity.  Alternative surgical approaches have been discussed including a comparison between conventional sternotomy and minimally-invasive techniques.  The relative risks and benefits of each have been reviewed as they pertain to the patient's specific circumstances, and expectations for the patient's postoperative convalescence has been discussed.  The patient desires to proceed with surgery tomorrow as scheduled.    The patient understands and accepts all potential risks of surgery including but not limited to risk of death, stroke or other neurologic complication, myocardial infarction, congestive heart failure, respiratory failure, renal failure, bleeding requiring transfusion and/or reexploration, arrhythmia, heart block or bradycardia requiring permanent pacemaker insertion, infection or other wound  complications, pneumonia, pleural and/or pericardial effusion, pulmonary embolus, aortic dissection or other major vascular complication, or other immediate or delayed complications related to valve repair or replacement including but not limited to recurrent or persistent mitral regurgitation and/or mitral stenosis, LV outflow tract obstruction, aortic insufficiency, paravalvular leak, posterior AV groove disruption, structural valve deterioration and failure, thrombosis, embolization, or endocarditis.  Specific risks potentially related to the minimally-invasive approach were discussed at length, including but not limited to risk of conversion to full or partial sternotomy, aortic dissection or other major vascular complication, unilateral acute lung injury or pulmonary edema, phrenic nerve dysfunction or paralysis, rib fracture, chronic pain, lung hernia, or lymphocele. All of her questions have been answered.  I spent in excess of 15 minutes during the conduct of this office consultation and >50% of this time involved direct face-to-face encounter with the patient for counseling and/or coordination of their care.   Salvatore Decent. Cornelius Moras, MD 08/30/2020 4:46 PM

## 2020-08-30 NOTE — Progress Notes (Signed)
Pre MVR has been completed.   Preliminary results in CV Proc.   Blanch Media 08/30/2020 2:19 PM

## 2020-08-30 NOTE — Progress Notes (Signed)
Surgical Instructions   Your procedure is scheduled on Thursday, February 10th.  Report to [facility] Main Entrance "A" at 5:30 A.M., then check in with the Admitting office.  Call this number if you have problems the morning of surgery:  [phone]   If you have any questions prior to your surgery date call [phone]: Open Monday-Friday 8am-4pm   Remember:  Do not eat or drink after midnight the night before your surgery    Take these medicines the morning of surgery with A SIP OF WATER  levonorgestrel-ethinyl estradiol (NORDETTE)  As of today, STOP taking any Aspirin (unless otherwise instructed by your surgeon) Aleve, Naproxen, Ibuprofen, Motrin, Advil, Goody's, BC's, all herbal medications, fish oil, and all vitamins.                     Do not wear jewelry, make up, or nail polish            Do not wear lotions, powders, perfumes, or deodorant.            Do not shave 48 hours prior to surgery.             Do not bring valuables to the hospital.            [facility] is not responsible for any belongings or valuables.  Do NOT Smoke (Tobacco/Vaping) or drink Alcohol 24 hours prior to your procedure If you use a CPAP at night, you may bring all equipment for your overnight stay.   Contacts, glasses, dentures or bridgework may not be worn into surgery, please bring cases for these belongings   For patients admitted to the hospital, discharge time will be determined by your treatment team.   Patients discharged the day of surgery will not be allowed to drive home, and someone needs to stay with them for 24 hours.  Special instructions:   [place]- Preparing For Surgery  Before surgery, you can play an important role. Because skin is not sterile, your skin needs to be as free of germs as possible. You can reduce the number of germs on your skin by washing with CHG (chlorahexidine gluconate) Soap before surgery.  CHG is an antiseptic cleaner which kills germs and bonds with  the skin to continue killing germs even after washing.    Oral Hygiene is also important to reduce your risk of infection.  Remember - BRUSH YOUR TEETH THE MORNING OF SURGERY WITH YOUR REGULAR TOOTHPASTE  Please do not use if you have an allergy to CHG or antibacterial soaps. If your skin becomes reddened/irritated stop using the CHG.  Do not shave (including legs and underarms) for at least 48 hours prior to first CHG shower. It is OK to shave your face.  Please follow these instructions carefully.   1. Shower the NIGHT BEFORE SURGERY and the MORNING OF SURGERY  2. If you chose to wash your hair, wash your hair first as usual with your normal shampoo.  3. After you shampoo, rinse your hair and body thoroughly to remove the shampoo.  4. Wash Face and genitals (private parts) with your normal soap.   5.  Shower the NIGHT BEFORE SURGERY and the MORNING OF SURGERY with CHG Soap.   6. Use CHG Soap as you would any other liquid soap. You can apply CHG directly to the skin and wash gently with a scrungie or a clean washcloth.   7. Apply the CHG Soap to your body ONLY  FROM THE NECK DOWN.  Do not use on open wounds or open sores. Avoid contact with your eyes, ears, mouth and genitals (private parts). Wash Face and genitals (private parts)  with your normal soap.   8. Wash thoroughly, paying special attention to the area where your surgery will be performed.  9. Thoroughly rinse your body with warm water from the neck down.  10. DO NOT shower/wash with your normal soap after using and rinsing off the CHG Soap.  11. Pat yourself dry with a CLEAN TOWEL.  12. Wear CLEAN PAJAMAS to bed the night before surgery  13. Place CLEAN SHEETS on your bed the night before your surgery  14. DO NOT SLEEP WITH PETS.  Day of Surgery: Shower with CHG soap Wear Clean/Comfortable clothing the morning of surgery Do not apply any deodorants/lotions.   Remember to brush your teeth WITH YOUR REGULAR  TOOTHPASTE.   Please read over the following fact sheets that you were given.

## 2020-08-30 NOTE — H&P (Signed)
[address]       [place] [address]             [phone]          CARDIOTHORACIC SURGERY HISTORY AND PHYSICAL EXAM  Referring Provider is Sherren Mocha, MD Primary Cardiologist is Velta Addison, Annie Main, MD Primary Care Physician is Paulita Fujita, MD  Chief Complaint  Patient presents with  . Mitral Regurgitation    Initial surgical consult, Cta today    HPI:  Patient is 55 year old female with longstanding history of mitral valve prolapse and mitral regurgitation who has been referred for surgical consultation.  Patient states that she was first told that she had a heart murmur by her pediatrician when she was in junior high school.  Several female family members have had the diagnosis of mitral valve prolapse as well.  She initially had several serial echocardiograms studies as well as Holter monitor for evaluation of palpitations.  She remained clinically stable and went for nearly 20 years without further formal cardiac follow-up.  More recently she has been followed with routine echocardiography.  Transthoracic echocardiograms have reportedly demonstrated the presence of normal left ventricular function with moderately severe and severe mitral regurgitation.  She was seen in follow-up by her cardiologist in [place] and underwent transesophageal echocardiogram which demonstrated myxomatous degenerative disease of the mitral valve with a large flail segment of the posterior leaflet and obvious ruptured chordae tendinae.  There was severe mitral regurgitation and normal left ventricular systolic function.  The patient discussed these findings with her cousin, Dr. Darlina Guys, and her transesophageal echocardiogram was forwarded to our multidisciplinary heart valve team for review.  The patient was seen in consultation by Dr. Burt Knack earlier today and subsequently underwent coronary CT angiography and was referred for surgical  consultation.  Patient is divorced and lives alone in [place], [place].  She has 2 adult children 1 of whom just graduated from [employer] and the other remains in college and runs track at [employer].  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  This primarily involves working from home.  The patient has remained physically active and entirely functionally independent throughout her adult life.  A year and a half ago she sustained an injury to her knee and that limited how much exercise she had been getting.  However, more recently she has been back walking on a regular basis.  She denies any significant physical limitations although she states that she "feels out of shape".  She does note that if she walks up a flight of stairs she seems to be a little bit short of breath by the time she gets to the top step, but this is mild and does not seem to limit her at all.  She otherwise has no symptoms of exertional shortness of breath or chest discomfort.  She has never had resting shortness of breath, PND, orthopnea, or lower extremity edema.  She states that she has had some palpitations in the past but she is no longer aware of them.  She has never had any dizziness or syncope.   Past Medical History:  Diagnosis Date  . Acute sinusitis   . Dermatitis   . Diarrhea   . Facial pain   . Family history of adverse reaction to anesthesia    PONV - mother  . Fever blister   . Headache   . Heart murmur   .  Malaise and fatigue   . Mitral valve prolapse   . Sinus infection     Past Surgical History:  Procedure Laterality Date  . WISDOM TOOTH EXTRACTION  1985    Family History  Problem Relation Age of Onset  . CAD Neg Hx     Social History Social History   Tobacco Use  . Smoking status: Never Smoker  . Smokeless tobacco: Never Used  Vaping Use  . Vaping Use: Never used  Substance Use Topics  . Alcohol use: Yes     Comment: social  . Drug use: Never    Prior to Admission medications   Medication Sig Start Date End Date Taking? Authorizing Provider  ibuprofen (ADVIL) 200 MG tablet Take 400 mg by mouth every 6 (six) hours as needed for mild pain or moderate pain.   Yes [provider]  levonorgestrel-ethinyl estradiol (NORDETTE) 0.15-30 MG-MCG tablet Take 1 tablet by mouth daily.   Yes [provider]    Allergies  Allergen Reactions  . Levaquin [Levofloxacin] Itching and Swelling      Review of Systems:              General:                      normal appetite, normal energy, no weight gain, no weight loss, no fever             Cardiac:                       no chest pain with exertion, no chest pain at rest, possibly mild SOB with more strenuous exertion, no resting SOB, no PND, no orthopnea, no palpitations, no arrhythmia, no atrial fibrillation, no LE edema, no dizzy spells, no syncope             Respiratory:                 no shortness of breath, no home oxygen, no productive cough, no dry cough, no bronchitis, no wheezing, no hemoptysis, no asthma, no pain with inspiration or cough, no sleep apnea, no CPAP at night             GI:                               no difficulty swallowing, no reflux, no frequent heartburn, no hiatal hernia, no abdominal pain, no constipation, no diarrhea, no hematochezia, no hematemesis, no melena             GU:                              no dysuria,  no frequency, no urinary tract infection, no hematuria, no kidney stones, no kidney disease             Vascular:                     no pain suggestive of claudication, no pain in feet, no leg cramps, no varicose veins, no DVT, no non-healing foot ulcer             Neuro:                         no stroke, no TIA's, no seizures, no headaches, no temporary  blindness one eye,  no slurred speech, no peripheral neuropathy, no chronic pain, no instability of gait, no memory/cognitive  dysfunction             Musculoskeletal:         no arthritis, no joint swelling, no myalgias, no difficulty walking, normal mobility              Skin:                            no rash, no itching, no skin infections, no pressure sores or ulcerations             Psych:                         no anxiety, no depression, no nervousness, no unusual recent stress             Eyes:                           no blurry vision, no floaters, no recent vision changes, + wears glasses or contacts             ENT:                            no hearing loss, no loose or painful teeth, no dentures, last saw dentist 2 weeks ago             Hematologic:               no easy bruising, no abnormal bleeding, no clotting disorder, no frequent epistaxis             Endocrine:                   no diabetes, does not check CBG's at home                           Physical Exam:              BP 114/74 (BP Location: Right Arm, Patient Position: Sitting)   Pulse 70   Resp 18   Ht _0  (1.702 m)   Wt 168 lb (76.2 kg)   SpO2 98% Comment: RA with mask on  BMI 26.31 kg/m              General:                       well-appearing             HEENT:                       Unremarkable              Neck:                           no JVD, no bruits, no adenopathy              Chest:                          clear to auscultation, symmetrical breath sounds, no wheezes, no rhonchi  CV:                              RRR, grade IV/VI holosystolic murmur heard all across the precordium, best at apex             Abdomen:                    soft, non-tender, no masses              Extremities:                 warm, well-perfused, pulses palpable, no LE edema             Rectal/GU                   Deferred             Neuro:                         Grossly non-focal and symmetrical throughout             Skin:                            Clean and dry, no rashes, no breakdown   Diagnostic  Tests:  TRANSTHORACIC ECHOCARDIOGRAM  Echocardiogram: Final Impressions:  1. The left ventricular chamber size is moderately dilated.  2. No significant left ventricular hypertrophy.  3. Global left ventricular wall motion and contractility are within  normal limits and the estimated ejection fraction is approximately 65%.  4. The left atrium is moderately dilated.  5. Unable to rule out PFO by color doppler. Patient declined bubble  study at this time.  6. There is prolapse of the posterior mitral valve leaflet. There is  likely severe, very eccentric regurgitation. The regurgitant jet is  predominantly anteriorly directed. The flow patterns of the pulmonary  veins were consistent with systolic predominance making it more  moderate.  7. There is also mild to moderate tricuspid and mild pulmonic  regurgitation.  8. No pulmonary hypertension identified. Estimated RVSP is 54mHg.  9. There is no pericardial effusion.  10. The inferior vena cava appearance suggests normal right ventricle  filling pressures.   Electronically signed in [facility] bOI:NOMVEHMEAndrey SpearmanM.D. on  05/26/2020 17:55:41    TRANS[facility]OPHAGEAL ECHOCARDIOGRAM  [facility]    PROCEDURE: Transesophageal echocardiogram PRE-PROCEDURE DX: Valvular heart disease POST-PROCEDURE DX: Valvular Heart Disease  PRIMARY PHYSICIAN: VConnye Burkitt MD  ADDITIONAL PHYSICIANS: None  ANESTHESIA TYPE: MAC  EBL: None or negligible  LINES: None  DRAINS: None  SPECIMENS: None  IMPLANTS: None  COMPLICATIONS: None  BRIEF FINDINGS: mod to severe MR, prolapse is severe with likely ruptured chord.  VConnye Burkitt MD    Electronically signed by VVelta Addison SDennie Fetters, MD at 06/12/2020 9:22 AM EST      Cardiac CTA  MEDICATIONS: Sub lingual nitro. 443mand lopressor 100 mg  TECHNIQUE: The patient was scanned on a [id number] scanner.  Gantry rotation speed was 250 msecs. Collimation was .6 mm. A 100 kV prospective scan was triggered in the ascending thoracic aorta at 140 HU's Full mA was used between 35% and 75% of the R-R interval. Average HR during the scan was 55 bpm. The 3D data  set was interpreted on a dedicated work station using MPR, MIP and VRT modes. A total of 80cc of contrast was used.  FINDINGS: Non-cardiac: See separate report from [facility]. No significant findings on limited lung and soft tissue windows.  Calcium Score: No calcium noted in coronary arteries  Coronary Arteries: Right dominant with no anomalies  LM: Normal  LAD: Normal  IM: Normal  D1: Normal  Circumflex: Normal  OM1: Normal  OM2: Normal  RCA: Normal  PDA: Normal  PLA: Normal  IMPRESSION: 1. Calcium score 0  2. Normal right dominant coronary arteries  3. Normal aortic root 3.0 cm  Jenkins Rouge  Electronically Signed: By: Jenkins Rouge M.D. On: 07/05/2020 10:57    CT ANGIOGRAPHY CHEST, ABDOMEN AND PELVIS  TECHNIQUE: Limited Non-contrast CT of the chest was initially obtained.  Multidetector CT imaging through the chest, abdomen and pelvis was performed using the standard protocol during bolus administration of intravenous contrast. Multiplanar reconstructed images and MIPs were obtained and reviewed to evaluate the vascular anatomy.  CONTRAST: 167m OMNIPAQUE IOHEXOL 350 MG/ML SOLN  COMPARISON: None.  FINDINGS: CTA CHEST FINDINGS  Cardiovascular: Heart size normal. No pericardial effusion. The RV is nondilated. Central pulmonary arteries unremarkable; the exam was not optimized for detection of pulmonary emboli. There is mild left atrial enlargement. Adequate contrast opacification of the thoracic aorta with no evidence of dissection, aneurysm, or stenosis. There is classic 3-vessel brachiocephalic arch anatomy without proximal stenosis. No significant  atheromatous plaque.  Mediastinum/Nodes: No mass or adenopathy.  Lungs/Pleura: No pleural effusion. No pneumothorax. Lungs are clear.  Musculoskeletal: Mild spondylitic changes in the visualized lower cervical and mid thoracic spine. No fracture or worrisome  Review of the MIP images confirms the above findings.  CTA ABDOMEN AND PELVIS FINDINGS  VASCULAR  Aorta: Normal caliber aorta without aneurysm, dissection, vasculitis or significant stenosis.  Celiac: Patent without evidence of aneurysm, dissection, vasculitis or significant stenosis.  SMA: Patent without evidence of aneurysm, dissection, vasculitis or significant stenosis.  Renals: Duplicated left, superior dominant, both patent. Single right, widely patent.  IMA: Patent without evidence of aneurysm, dissection, vasculitis or significant stenosis.  Inflow: Mild tortuosity of the iliac arterial system. No aneurysm, dissection, or significant atheromatous change.  Veins: Mildly dilated refluxing left ovarian vein. Patent portal vein and bilateral renal veins.  Review of the MIP images confirms the above findings.  NON-VASCULAR  Hepatobiliary: 0.6 and 1.1 cm low-attenuation lesions in hepatic segment 7 statistically most likely cysts in the absence of a history of primary carcinoma, but incompletely characterized. 1.1 Cm enhancing nonspecific lesion in segment 5 (Im349,Se6) , possibly FNH or adenoma but nonspecific. No biliary ductal dilatation. Gallbladder unremarkable.  Pancreas: Unremarkable. No pancreatic ductal dilatation or surrounding inflammatory changes.  Spleen: Normal in size without focal abnormality.  Adrenals/Urinary Tract: Adrenal glands are unremarkable. Kidneys are normal, without renal calculi, focal lesion, or hydronephrosis. Bladder is unremarkable.  Stomach/Bowel: Stomach and small bowel are nondilated, unremarkable. Normal appendix. The colon is nondilated with a  few scattered distal descending diverticula; no significant adjacent inflammatory change.  Lymphatic: No abdominal or pelvic adenopathy.  Reproductive: Degenerated 4.6 cm fibroid with coarse calcifications. No adnexal mass.  Other: No ascites. No free air.  Musculoskeletal: Mild thoracolumbar scoliosis with spondylitic changes in the lower lumbar spine. No fracture or worrisome bone lesion.  Review of the MIP images confirms the above findings.  IMPRESSION: 1. Negative for aortic dissection, aneurysm, or significant atheromatous change. 2. Mild tortuosity of the iliac  arterial system. 3. Degenerated 4.6 cm uterine fibroid. 4. Small hepatic segment 7 lesions, statistically most likely cysts in the absence of a history of primary carcinoma, but incompletely characterized. 5. 1.1cm enhancing lesion in segment 5, possibly FNH or adenoma but nonspecific. If clinically indicated, MR liver with contrast may be useful for further characterization.   Electronically Signed By: Lucrezia Europe M.D. On: 07/05/2020 11:34     Impression:  Patient has mitral valve prolapse with at least stage C and possibly very early stage D severe primary mitral regurgitation. She remains essentially asymptomatic although she states that she has noticed that she gets a little bit short of breath when she walks up a flight of stairs. She remains fairly active physically and walks on a regular basis and reports no other symptoms of exertional shortness of breath nor chest discomfort.  I personally reviewed images from the patient's recent transesophageal echocardiogram performed June 12, 2020 and CT angiogram performed earlier today. Patient has myxomatous degenerative disease of the mitral valve with an obvious flail segment involving a portion of the middle scallop (P2) of the posterior leaflet. There is at least 1 ruptured primary chordae tendinae.There is severe mitral regurgitation  with an eccentric jet that courses around the anterior portion of the left atrium. There is normal left ventricular size and systolic function. The aortic valve appears normal. Right ventricular size and function is normal. There is trivial tricuspid regurgitation. Gated coronary CT angiogram reveals normal coronary artery anatomy with no significant coronary artery disease and no calcium in the coronary arteries. CT angiography of the chest, abdomen and pelvis revealed normal thoracic and abdominal aorta with normal iliac arteries and pelvic vasculature.  Options include continued close observation on medical therapy versus elective mitral valve repair. Based upon review of the patient's recent transesophageal echocardiogram I feel that her valve should be repairable with greater than 98% confidence and anticipated surgical risk of mortality below 1%. The patient appears to be a good candidate for minimally invasive approach for surgery.   Plan:  The patientwas againcounseled at length in the preoperative holding area regarding the indications, risks and potential benefits of mitral valve repair as well as the timing of surgical intervention. The rationale for elective surgery has been explained, including a comparison between surgery and continued medical therapy with close follow-up. The likelihood of successful and durable mitral valve repair has been discussed with particular reference to the findings of the most recent echocardiogram. Based upon these findings and previous experience, I have quoted a greater than 98percent likelihood of successful valve repair with less than 1percent risk of mortality or major morbidity. Alternative surgical approaches have been discussed including a comparison between conventional sternotomy and minimally-invasive techniques. The relative risks and benefits of each have been reviewed as they pertain to the patient's specific circumstances, and  expectations for the patient's postoperative convalescence has been discussed. The patient desires to proceed with surgery tomorrow as scheduled.   The patient understands and accepts all potential risks of surgery including but not limited to risk of death, stroke or other neurologic complication, myocardial infarction, congestive heart failure, respiratory failure, renal failure, bleeding requiring transfusion and/or reexploration, arrhythmia, heart block or bradycardia requiring permanent pacemaker insertion, infection or other wound complications, pneumonia, pleural and/or pericardial effusion, pulmonary embolus, aortic dissection or other major vascular complication, or other immediate or delayed complications related to valve repair or replacement including but not limited to recurrent or persistent mitral regurgitation and/or mitral stenosis, LV outflow  tract obstruction, aortic insufficiency, paravalvular leak, posterior AV groove disruption, structural valve deterioration and failure, thrombosis, embolization, or endocarditis. Specific risks potentially related to the minimally-invasive approach were discussed at length, including but not limited to risk of conversion to full or partial sternotomy, aortic dissection or other major vascular complication, unilateral acute lung injury or pulmonary edema, phrenic nerve dysfunction or paralysis, rib fracture, chronic pain, lung hernia, or lymphocele. All of herquestions have been answered.     Valentina Gu. Roxy Manns, MD 08/30/2020 4:46 PM

## 2020-08-31 ENCOUNTER — Inpatient Hospital Stay (HOSPITAL_COMMUNITY): Payer: Federal, State, Local not specified - PPO | Admitting: Certified Registered Nurse Anesthetist

## 2020-08-31 ENCOUNTER — Encounter (HOSPITAL_COMMUNITY)
Admission: RE | Disposition: A | Discharge status: Home / Self Care | Payer: Self-pay | Source: Home / Self Care | Attending: Thoracic Surgery (Cardiothoracic Vascular Surgery)

## 2020-08-31 ENCOUNTER — Other Ambulatory Visit: Payer: Self-pay

## 2020-08-31 ENCOUNTER — Inpatient Hospital Stay (HOSPITAL_COMMUNITY): Payer: Federal, State, Local not specified - PPO

## 2020-08-31 ENCOUNTER — Inpatient Hospital Stay (HOSPITAL_COMMUNITY): Payer: Federal, State, Local not specified - PPO | Admitting: Physician Assistant

## 2020-08-31 ENCOUNTER — Inpatient Hospital Stay (HOSPITAL_COMMUNITY)
Admission: RE | Admit: 2020-08-31 | Discharge: 2020-09-04 | DRG: 219 | Disposition: A | Discharge status: Home / Self Care | Payer: Federal, State, Local not specified - PPO | Attending: Thoracic Surgery (Cardiothoracic Vascular Surgery) | Admitting: Thoracic Surgery (Cardiothoracic Vascular Surgery)

## 2020-08-31 ENCOUNTER — Encounter (HOSPITAL_COMMUNITY): Payer: Self-pay | Admitting: Thoracic Surgery (Cardiothoracic Vascular Surgery)

## 2020-08-31 DIAGNOSIS — D62 Acute posthemorrhagic anemia: Secondary | ICD-10-CM | POA: Diagnosis not present

## 2020-08-31 DIAGNOSIS — Z20822 Contact with and (suspected) exposure to covid-19: Secondary | ICD-10-CM | POA: Diagnosis present

## 2020-08-31 DIAGNOSIS — I340 Nonrheumatic mitral (valve) insufficiency: Principal | ICD-10-CM | POA: Diagnosis present

## 2020-08-31 DIAGNOSIS — I511 Rupture of chordae tendineae, not elsewhere classified: Secondary | ICD-10-CM | POA: Diagnosis present

## 2020-08-31 DIAGNOSIS — I341 Nonrheumatic mitral (valve) prolapse: Secondary | ICD-10-CM | POA: Diagnosis present

## 2020-08-31 DIAGNOSIS — Z79899 Other long term (current) drug therapy: Secondary | ICD-10-CM

## 2020-08-31 DIAGNOSIS — Z9889 Other specified postprocedural states: Secondary | ICD-10-CM

## 2020-08-31 DIAGNOSIS — Z006 Encounter for examination for normal comparison and control in clinical research program: Secondary | ICD-10-CM

## 2020-08-31 DIAGNOSIS — Z888 Allergy status to other drugs, medicaments and biological substances status: Secondary | ICD-10-CM | POA: Diagnosis not present

## 2020-08-31 DIAGNOSIS — E8770 Fluid overload, unspecified: Secondary | ICD-10-CM | POA: Diagnosis not present

## 2020-08-31 DIAGNOSIS — J9811 Atelectasis: Secondary | ICD-10-CM

## 2020-08-31 HISTORY — PX: TEE WITHOUT CARDIOVERSION: SHX5443

## 2020-08-31 HISTORY — DX: Other specified postprocedural states: Z98.890

## 2020-08-31 HISTORY — PX: MITRAL VALVE REPAIR: SHX2039

## 2020-08-31 LAB — POCT I-STAT 7, (LYTES, BLD GAS, ICA,H+H)
Acid-base deficit: 2 mmol/L (ref 0.0–2.0)
Acid-base deficit: 2 mmol/L (ref 0.0–2.0)
Acid-base deficit: 4 mmol/L — ABNORMAL HIGH (ref 0.0–2.0)
Acid-base deficit: 5 mmol/L — ABNORMAL HIGH (ref 0.0–2.0)
Acid-base deficit: 8 mmol/L — ABNORMAL HIGH (ref 0.0–2.0)
Acid-base deficit: 6 mmol/L — ABNORMAL HIGH (ref 0.0–2.0)
Bicarbonate: 24.2 mmol/L (ref 20.0–28.0)
Bicarbonate: 24.1 mmol/L (ref 20.0–28.0)
Bicarbonate: 22.2 mmol/L (ref 20.0–28.0)
Bicarbonate: 18.9 mmol/L — ABNORMAL LOW (ref 20.0–28.0)
Bicarbonate: 20.4 mmol/L (ref 20.0–28.0)
Bicarbonate: 20.8 mmol/L (ref 20.0–28.0)
Calcium, Ion: 1.22 mmol/L (ref 1.15–1.40)
Calcium, Ion: 0.92 mmol/L — ABNORMAL LOW (ref 1.15–1.40)
Calcium, Ion: 1.05 mmol/L — ABNORMAL LOW (ref 1.15–1.40)
Calcium, Ion: 1.02 mmol/L — ABNORMAL LOW (ref 1.15–1.40)
Calcium, Ion: 1.03 mmol/L — ABNORMAL LOW (ref 1.15–1.40)
Calcium, Ion: 1.11 mmol/L — ABNORMAL LOW (ref 1.15–1.40)
HCT: 36 % (ref 36.0–46.0)
HCT: 27 % — ABNORMAL LOW (ref 36.0–46.0)
HCT: 27 % — ABNORMAL LOW (ref 36.0–46.0)
HCT: 26 % — ABNORMAL LOW (ref 36.0–46.0)
HCT: 30 % — ABNORMAL LOW (ref 36.0–46.0)
HCT: 36 % (ref 36.0–46.0)
Hemoglobin: 12.2 g/dL (ref 12.0–15.0)
Hemoglobin: 9.2 g/dL — ABNORMAL LOW (ref 12.0–15.0)
Hemoglobin: 9.2 g/dL — ABNORMAL LOW (ref 12.0–15.0)
Hemoglobin: 10.2 g/dL — ABNORMAL LOW (ref 12.0–15.0)
Hemoglobin: 8.8 g/dL — ABNORMAL LOW (ref 12.0–15.0)
Hemoglobin: 12.2 g/dL (ref 12.0–15.0)
O2 Saturation: 100 %
O2 Saturation: 100 %
O2 Saturation: 100 %
O2 Saturation: 100 %
O2 Saturation: 97 %
O2 Saturation: 98 %
Patient temperature: 35.8
Potassium: 3.6 mmol/L (ref 3.5–5.1)
Potassium: 3.8 mmol/L (ref 3.5–5.1)
Potassium: 4.1 mmol/L (ref 3.5–5.1)
Potassium: 3.9 mmol/L (ref 3.5–5.1)
Potassium: 4.2 mmol/L (ref 3.5–5.1)
Potassium: 3.7 mmol/L (ref 3.5–5.1)
Sodium: 139 mmol/L (ref 135–145)
Sodium: 140 mmol/L (ref 135–145)
Sodium: 139 mmol/L (ref 135–145)
Sodium: 140 mmol/L (ref 135–145)
Sodium: 141 mmol/L (ref 135–145)
Sodium: 141 mmol/L (ref 135–145)
TCO2: 26 mmol/L (ref 22–32)
TCO2: 25 mmol/L (ref 22–32)
TCO2: 23 mmol/L (ref 22–32)
TCO2: 20 mmol/L — ABNORMAL LOW (ref 22–32)
TCO2: 22 mmol/L (ref 22–32)
TCO2: 22 mmol/L (ref 22–32)
pCO2 arterial: 47.5 mmHg (ref 32.0–48.0)
pCO2 arterial: 44.7 mmHg (ref 32.0–48.0)
pCO2 arterial: 43.1 mmHg (ref 32.0–48.0)
pCO2 arterial: 38.1 mmHg (ref 32.0–48.0)
pCO2 arterial: 46.8 mmHg (ref 32.0–48.0)
pCO2 arterial: 41.5 mmHg (ref 32.0–48.0)
pH, Arterial: 7.314 — ABNORMAL LOW (ref 7.350–7.450)
pH, Arterial: 7.34 — ABNORMAL LOW (ref 7.350–7.450)
pH, Arterial: 7.32 — ABNORMAL LOW (ref 7.350–7.450)
pH, Arterial: 7.216 — ABNORMAL LOW (ref 7.350–7.450)
pH, Arterial: 7.336 — ABNORMAL LOW (ref 7.350–7.450)
pH, Arterial: 7.303 — ABNORMAL LOW (ref 7.350–7.450)
pO2, Arterial: 459 mmHg — ABNORMAL HIGH (ref 83.0–108.0)
pO2, Arterial: 414 mmHg — ABNORMAL HIGH (ref 83.0–108.0)
pO2, Arterial: 373 mmHg — ABNORMAL HIGH (ref 83.0–108.0)
pO2, Arterial: 114 mmHg — ABNORMAL HIGH (ref 83.0–108.0)
pO2, Arterial: 372 mmHg — ABNORMAL HIGH (ref 83.0–108.0)
pO2, Arterial: 120 mmHg — ABNORMAL HIGH (ref 83.0–108.0)

## 2020-08-31 LAB — POCT I-STAT, CHEM 8
BUN: 7 mg/dL (ref 6–20)
BUN: 6 mg/dL (ref 6–20)
BUN: 7 mg/dL (ref 6–20)
BUN: 7 mg/dL (ref 6–20)
BUN: 7 mg/dL (ref 6–20)
Calcium, Ion: 1.17 mmol/L (ref 1.15–1.40)
Calcium, Ion: 1.06 mmol/L — ABNORMAL LOW (ref 1.15–1.40)
Calcium, Ion: 1.19 mmol/L (ref 1.15–1.40)
Calcium, Ion: 1.09 mmol/L — ABNORMAL LOW (ref 1.15–1.40)
Calcium, Ion: 1.07 mmol/L — ABNORMAL LOW (ref 1.15–1.40)
Chloride: 105 mmol/L (ref 98–111)
Chloride: 106 mmol/L (ref 98–111)
Chloride: 106 mmol/L (ref 98–111)
Chloride: 108 mmol/L (ref 98–111)
Chloride: 108 mmol/L (ref 98–111)
Creatinine, Ser: 0.5 mg/dL (ref 0.44–1.00)
Creatinine, Ser: 0.5 mg/dL (ref 0.44–1.00)
Creatinine, Ser: 0.5 mg/dL (ref 0.44–1.00)
Creatinine, Ser: 0.5 mg/dL (ref 0.44–1.00)
Creatinine, Ser: 0.4 mg/dL — ABNORMAL LOW (ref 0.44–1.00)
Glucose, Bld: 111 mg/dL — ABNORMAL HIGH (ref 70–99)
Glucose, Bld: 123 mg/dL — ABNORMAL HIGH (ref 70–99)
Glucose, Bld: 152 mg/dL — ABNORMAL HIGH (ref 70–99)
Glucose, Bld: 180 mg/dL — ABNORMAL HIGH (ref 70–99)
Glucose, Bld: 132 mg/dL — ABNORMAL HIGH (ref 70–99)
HCT: 35 % — ABNORMAL LOW (ref 36.0–46.0)
HCT: 26 % — ABNORMAL LOW (ref 36.0–46.0)
HCT: 33 % — ABNORMAL LOW (ref 36.0–46.0)
HCT: 26 % — ABNORMAL LOW (ref 36.0–46.0)
HCT: 28 % — ABNORMAL LOW (ref 36.0–46.0)
Hemoglobin: 11.9 g/dL — ABNORMAL LOW (ref 12.0–15.0)
Hemoglobin: 11.2 g/dL — ABNORMAL LOW (ref 12.0–15.0)
Hemoglobin: 8.8 g/dL — ABNORMAL LOW (ref 12.0–15.0)
Hemoglobin: 8.8 g/dL — ABNORMAL LOW (ref 12.0–15.0)
Hemoglobin: 9.5 g/dL — ABNORMAL LOW (ref 12.0–15.0)
Potassium: 3.6 mmol/L (ref 3.5–5.1)
Potassium: 3.8 mmol/L (ref 3.5–5.1)
Potassium: 4.2 mmol/L (ref 3.5–5.1)
Potassium: 4 mmol/L (ref 3.5–5.1)
Potassium: 4 mmol/L (ref 3.5–5.1)
Sodium: 140 mmol/L (ref 135–145)
Sodium: 138 mmol/L (ref 135–145)
Sodium: 138 mmol/L (ref 135–145)
Sodium: 139 mmol/L (ref 135–145)
Sodium: 141 mmol/L (ref 135–145)
TCO2: 24 mmol/L (ref 22–32)
TCO2: 25 mmol/L (ref 22–32)
TCO2: 26 mmol/L (ref 22–32)
TCO2: 23 mmol/L (ref 22–32)
TCO2: 20 mmol/L — ABNORMAL LOW (ref 22–32)

## 2020-08-31 LAB — BASIC METABOLIC PANEL
Anion gap: 12 (ref 5–15)
BUN: 7 mg/dL (ref 6–20)
CO2: 19 mmol/L — ABNORMAL LOW (ref 22–32)
Calcium: 7.4 mg/dL — ABNORMAL LOW (ref 8.9–10.3)
Chloride: 106 mmol/L (ref 98–111)
Creatinine, Ser: 0.67 mg/dL (ref 0.44–1.00)
GFR, Estimated: >60 mL/min (ref >60)
Glucose, Bld: 140 mg/dL — ABNORMAL HIGH (ref 70–99)
Potassium: 3.8 mmol/L (ref 3.5–5.1)
Sodium: 137 mmol/L (ref 135–145)

## 2020-08-31 LAB — PROTIME-INR
INR: 1.3 — ABNORMAL HIGH (ref 0.8–1.2)
Prothrombin Time: 15.9 seconds — ABNORMAL HIGH (ref 11.4–15.2)

## 2020-08-31 LAB — CBC
HCT: 33.8 % — ABNORMAL LOW (ref 36.0–46.0)
HCT: 31.3 % — ABNORMAL LOW (ref 36.0–46.0)
Hemoglobin: 11.6 g/dL — ABNORMAL LOW (ref 12.0–15.0)
Hemoglobin: 10.9 g/dL — ABNORMAL LOW (ref 12.0–15.0)
MCH: 32.3 pg (ref 26.0–34.0)
MCH: 32.5 pg (ref 26.0–34.0)
MCHC: 34.3 g/dL (ref 30.0–36.0)
MCHC: 34.8 g/dL (ref 30.0–36.0)
MCV: 94.2 fL (ref 80.0–100.0)
MCV: 93.4 fL (ref 80.0–100.0)
Platelets: 183 10*3/uL (ref 150–400)
Platelets: 170 10*3/uL (ref 150–400)
RBC: 3.59 MIL/uL — ABNORMAL LOW (ref 3.87–5.11)
RBC: 3.35 MIL/uL — ABNORMAL LOW (ref 3.87–5.11)
RDW: 12.2 % (ref 11.5–15.5)
RDW: 12.2 % (ref 11.5–15.5)
WBC: 16.8 10*3/uL — ABNORMAL HIGH (ref 4.0–10.5)
WBC: 9.6 10*3/uL (ref 4.0–10.5)
nRBC: 0 % (ref 0.0–0.2)
nRBC: 0 % (ref 0.0–0.2)

## 2020-08-31 LAB — GLUCOSE, CAPILLARY
Glucose-Capillary: 114 mg/dL — ABNORMAL HIGH (ref 70–99)
Glucose-Capillary: 125 mg/dL — ABNORMAL HIGH (ref 70–99)
Glucose-Capillary: 126 mg/dL — ABNORMAL HIGH (ref 70–99)
Glucose-Capillary: 127 mg/dL — ABNORMAL HIGH (ref 70–99)
Glucose-Capillary: 133 mg/dL — ABNORMAL HIGH (ref 70–99)
Glucose-Capillary: 135 mg/dL — ABNORMAL HIGH (ref 70–99)
Glucose-Capillary: 137 mg/dL — ABNORMAL HIGH (ref 70–99)
Glucose-Capillary: 170 mg/dL — ABNORMAL HIGH (ref 70–99)

## 2020-08-31 LAB — POCT I-STAT EG7
Acid-base deficit: 2 mmol/L (ref 0.0–2.0)
Bicarbonate: 24.2 mmol/L (ref 20.0–28.0)
Calcium, Ion: 0.98 mmol/L — ABNORMAL LOW (ref 1.15–1.40)
HCT: 26 % — ABNORMAL LOW (ref 36.0–46.0)
Hemoglobin: 8.8 g/dL — ABNORMAL LOW (ref 12.0–15.0)
O2 Saturation: 80 %
Potassium: 3.7 mmol/L (ref 3.5–5.1)
Sodium: 140 mmol/L (ref 135–145)
TCO2: 26 mmol/L (ref 22–32)
pCO2, Ven: 46.9 mmHg (ref 44.0–60.0)
pH, Ven: 7.321 (ref 7.250–7.430)
pO2, Ven: 48 mmHg — ABNORMAL HIGH (ref 32.0–45.0)

## 2020-08-31 LAB — APTT: aPTT: 29 seconds (ref 24–36)

## 2020-08-31 LAB — PLATELET COUNT: Platelets: 200 10*3/uL (ref 150–400)

## 2020-08-31 LAB — POCT PREGNANCY, URINE: Preg Test, Ur: NEGATIVE

## 2020-08-31 LAB — ECHO INTRAOPERATIVE TEE: Height: 67 in

## 2020-08-31 LAB — HEMOGLOBIN AND HEMATOCRIT, BLOOD
HCT: 26.9 % — ABNORMAL LOW (ref 36.0–46.0)
Hemoglobin: 8.9 g/dL — ABNORMAL LOW (ref 12.0–15.0)

## 2020-08-31 LAB — MAGNESIUM: Magnesium: 2.9 mg/dL — ABNORMAL HIGH (ref 1.7–2.4)

## 2020-08-31 LAB — ABO/RH: ABO/RH(D): A POS

## 2020-08-31 SURGERY — REPAIR, MITRAL VALVE, MINIMALLY INVASIVE
Anesthesia: General | Site: Chest | Laterality: Right

## 2020-08-31 MED ORDER — PANTOPRAZOLE SODIUM 40 MG PO TBEC
40.0000 mg | DELAYED_RELEASE_TABLET | Freq: Every day | ORAL | Status: DC
  Administered 2020-09-02 – 2020-09-04 (×3): 40 mg via ORAL
  Filled 2020-08-31 (×3): qty 1

## 2020-08-31 MED ORDER — SODIUM CHLORIDE 0.9 % IV SOLN: 1.5000 g | Freq: Two times a day (BID) | INTRAVENOUS | Status: DC

## 2020-08-31 MED ORDER — SODIUM CHLORIDE 0.9% FLUSH: 3.0000 mL | INTRAVENOUS | Status: DC | PRN

## 2020-08-31 MED ORDER — 0.9 % SODIUM CHLORIDE (POUR BTL) OPTIME
TOPICAL | Status: DC | PRN
  Administered 2020-08-31: 4000 mL

## 2020-08-31 MED ORDER — CHLORHEXIDINE GLUCONATE 0.12 % MT SOLN
15.0000 mL | Freq: Two times a day (BID) | OROMUCOSAL | Status: DC
  Administered 2020-09-01 – 2020-09-03 (×6): 15 mL via OROMUCOSAL
  Filled 2020-08-31 (×4): qty 15

## 2020-08-31 MED ORDER — BISACODYL 5 MG PO TBEC
10.0000 mg | DELAYED_RELEASE_TABLET | Freq: Every day | ORAL | Status: DC
  Administered 2020-09-01 – 2020-09-03 (×3): 10 mg via ORAL
  Filled 2020-08-31 (×3): qty 2

## 2020-08-31 MED ORDER — ASPIRIN 81 MG PO CHEW: 324.0000 mg | CHEWABLE_TABLET | Freq: Every day | ORAL | Status: DC

## 2020-08-31 MED ORDER — SODIUM CHLORIDE 0.9 % IV SOLN: INTRAVENOUS | Status: AC

## 2020-08-31 MED ORDER — INSULIN REGULAR(HUMAN) IN NACL 100-0.9 UT/100ML-% IV SOLN: INTRAVENOUS | Status: DC

## 2020-08-31 MED ORDER — PROPOFOL 10 MG/ML IV BOLUS
INTRAVENOUS | Status: AC
  Filled 2020-08-31: qty 20

## 2020-08-31 MED ORDER — ACETAMINOPHEN 500 MG PO TABS
1000.0000 mg | ORAL_TABLET | Freq: Four times a day (QID) | ORAL | Status: DC
  Administered 2020-09-01 – 2020-09-04 (×12): 1000 mg via ORAL
  Filled 2020-08-31 (×12): qty 2

## 2020-08-31 MED ORDER — CHLORHEXIDINE GLUCONATE 4 % EX LIQD: 30.0000 mL | CUTANEOUS | Status: DC

## 2020-08-31 MED ORDER — CHLORHEXIDINE GLUCONATE CLOTH 2 % EX PADS
6.0000 | MEDICATED_PAD | Freq: Every day | CUTANEOUS | Status: DC
  Administered 2020-09-01 – 2020-09-02 (×2): 6 via TOPICAL

## 2020-08-31 MED ORDER — ALBUMIN HUMAN 5 % IV SOLN: INTRAVENOUS | Status: DC | PRN

## 2020-08-31 MED ORDER — PROTAMINE SULFATE 10 MG/ML IV SOLN
INTRAVENOUS | Status: DC | PRN
  Administered 2020-08-31: 30 mg via INTRAVENOUS
  Administered 2020-08-31: 210 mg via INTRAVENOUS
  Administered 2020-08-31: 30 mg via INTRAVENOUS

## 2020-08-31 MED ORDER — DEXAMETHASONE SODIUM PHOSPHATE 10 MG/ML IJ SOLN
INTRAMUSCULAR | Status: AC
  Filled 2020-08-31: qty 1

## 2020-08-31 MED ORDER — PROTAMINE SULFATE 10 MG/ML IV SOLN
INTRAVENOUS | Status: AC
  Filled 2020-08-31: qty 25

## 2020-08-31 MED ORDER — PHENYLEPHRINE 40 MCG/ML (10ML) SYRINGE FOR IV PUSH (FOR BLOOD PRESSURE SUPPORT)
PREFILLED_SYRINGE | INTRAVENOUS | Status: DC | PRN
  Administered 2020-08-31: 80 ug via INTRAVENOUS
  Administered 2020-08-31: 120 ug via INTRAVENOUS
  Administered 2020-08-31: 80 ug via INTRAVENOUS

## 2020-08-31 MED ORDER — SODIUM CHLORIDE 0.45 % IV SOLN: INTRAVENOUS | Status: DC | PRN

## 2020-08-31 MED ORDER — SODIUM CHLORIDE 0.9 % IV SOLN
1.5000 g | Freq: Two times a day (BID) | INTRAVENOUS | Status: AC
  Administered 2020-08-31 – 2020-09-02 (×4): 1.5 g via INTRAVENOUS
  Filled 2020-08-31 (×4): qty 1.5

## 2020-08-31 MED ORDER — PROTAMINE SULFATE 10 MG/ML IV SOLN
INTRAVENOUS | Status: AC
  Filled 2020-08-31: qty 5

## 2020-08-31 MED ORDER — SODIUM CHLORIDE 0.9 % IV SOLN: 250.0000 mL | INTRAVENOUS | Status: DC

## 2020-08-31 MED ORDER — EPHEDRINE 5 MG/ML INJ
INTRAVENOUS | Status: AC
  Filled 2020-08-31: qty 10

## 2020-08-31 MED ORDER — LACTATED RINGERS IV SOLN: INTRAVENOUS | Status: DC | PRN

## 2020-08-31 MED ORDER — POTASSIUM CHLORIDE 10 MEQ/50ML IV SOLN
10.0000 meq | INTRAVENOUS | Status: AC
  Administered 2020-08-31 (×3): 10 meq via INTRAVENOUS

## 2020-08-31 MED ORDER — DEXTROSE 50 % IV SOLN: 0.0000 mL | INTRAVENOUS | Status: DC | PRN

## 2020-08-31 MED ORDER — ONDANSETRON HCL 4 MG/2ML IJ SOLN
INTRAMUSCULAR | Status: DC | PRN
  Administered 2020-08-31: 4 mg via INTRAVENOUS

## 2020-08-31 MED ORDER — BUPIVACAINE HCL (PF) 0.5 % IJ SOLN
INTRAMUSCULAR | Status: AC
  Filled 2020-08-31: qty 30

## 2020-08-31 MED ORDER — LIDOCAINE 2% (20 MG/ML) 5 ML SYRINGE
INTRAMUSCULAR | Status: AC
  Filled 2020-08-31: qty 5

## 2020-08-31 MED ORDER — FENTANYL CITRATE (PF) 250 MCG/5ML IJ SOLN
INTRAMUSCULAR | Status: AC
  Filled 2020-08-31: qty 15

## 2020-08-31 MED ORDER — TRAMADOL HCL 50 MG PO TABS
50.0000 mg | ORAL_TABLET | ORAL | Status: DC | PRN
  Administered 2020-09-01 – 2020-09-02 (×4): 50 mg via ORAL
  Filled 2020-08-31 (×4): qty 1

## 2020-08-31 MED ORDER — NITROGLYCERIN IN D5W 200-5 MCG/ML-% IV SOLN: 0.0000 ug/min | INTRAVENOUS | Status: DC

## 2020-08-31 MED ORDER — DEXAMETHASONE SODIUM PHOSPHATE 10 MG/ML IJ SOLN
INTRAMUSCULAR | Status: DC | PRN
  Administered 2020-08-31: 10 mg via INTRAVENOUS

## 2020-08-31 MED ORDER — PHENYLEPHRINE HCL-NACL 20-0.9 MG/250ML-% IV SOLN: 0.0000 ug/min | INTRAVENOUS | Status: DC

## 2020-08-31 MED ORDER — MIDAZOLAM HCL 5 MG/5ML IJ SOLN
INTRAMUSCULAR | Status: DC | PRN
  Administered 2020-08-31: 1 mg via INTRAVENOUS
  Administered 2020-08-31: 2 mg via INTRAVENOUS
  Administered 2020-08-31 (×2): 1 mg via INTRAVENOUS
  Administered 2020-08-31: 2 mg via INTRAVENOUS
  Administered 2020-08-31: 1 mg via INTRAVENOUS

## 2020-08-31 MED ORDER — DEXMEDETOMIDINE HCL IN NACL 400 MCG/100ML IV SOLN: 0.0000 ug/kg/h | INTRAVENOUS | Status: DC

## 2020-08-31 MED ORDER — BISACODYL 10 MG RE SUPP: 10.0000 mg | Freq: Every day | RECTAL | Status: DC

## 2020-08-31 MED ORDER — MIDAZOLAM HCL 2 MG/2ML IJ SOLN: 2.0000 mg | INTRAMUSCULAR | Status: DC | PRN

## 2020-08-31 MED ORDER — DOCUSATE SODIUM 100 MG PO CAPS
200.0000 mg | ORAL_CAPSULE | Freq: Every day | ORAL | Status: DC
  Administered 2020-09-01 – 2020-09-03 (×3): 200 mg via ORAL
  Filled 2020-08-31 (×4): qty 2

## 2020-08-31 MED ORDER — SUCCINYLCHOLINE CHLORIDE 200 MG/10ML IV SOSY
PREFILLED_SYRINGE | INTRAVENOUS | Status: AC
  Filled 2020-08-31: qty 10

## 2020-08-31 MED ORDER — PHENYLEPHRINE 40 MCG/ML (10ML) SYRINGE FOR IV PUSH (FOR BLOOD PRESSURE SUPPORT)
PREFILLED_SYRINGE | INTRAVENOUS | Status: AC
  Filled 2020-08-31: qty 10

## 2020-08-31 MED ORDER — SODIUM BICARBONATE 8.4 % IV SOLN
INTRAVENOUS | Status: DC | PRN
  Administered 2020-08-31: 50 meq via INTRAVENOUS

## 2020-08-31 MED ORDER — SODIUM CHLORIDE 0.9 % IV SOLN: INTRAVENOUS | Status: DC

## 2020-08-31 MED ORDER — ARTIFICIAL TEARS OPHTHALMIC OINT
TOPICAL_OINTMENT | OPHTHALMIC | Status: AC
  Filled 2020-08-31: qty 3.5

## 2020-08-31 MED ORDER — ACETAMINOPHEN 160 MG/5ML PO SOLN: 1000.0000 mg | Freq: Four times a day (QID) | ORAL | Status: DC

## 2020-08-31 MED ORDER — ROCURONIUM BROMIDE 10 MG/ML (PF) SYRINGE
PREFILLED_SYRINGE | INTRAVENOUS | Status: AC
  Filled 2020-08-31: qty 10

## 2020-08-31 MED ORDER — LACTATED RINGERS IV SOLN: INTRAVENOUS | Status: DC

## 2020-08-31 MED ORDER — CHLORHEXIDINE GLUCONATE 0.12 % MT SOLN
15.0000 mL | Freq: Once | OROMUCOSAL | Status: AC
  Administered 2020-08-31: 15 mL via OROMUCOSAL
  Filled 2020-08-31: qty 15

## 2020-08-31 MED ORDER — OXYCODONE HCL 5 MG PO TABS
5.0000 mg | ORAL_TABLET | ORAL | Status: DC | PRN
  Administered 2020-09-02: 5 mg via ORAL
  Filled 2020-08-31: qty 1

## 2020-08-31 MED ORDER — PLASMA-LYTE 148 IV SOLN
INTRAVENOUS | Status: DC | PRN
  Administered 2020-08-31: 500 mL via INTRAVASCULAR

## 2020-08-31 MED ORDER — CALCIUM CHLORIDE 10 % IV SOLN
INTRAVENOUS | Status: DC | PRN
  Administered 2020-08-31 (×2): 100 mg via INTRAVENOUS

## 2020-08-31 MED ORDER — METOPROLOL TARTRATE 5 MG/5ML IV SOLN: 2.5000 mg | INTRAVENOUS | Status: DC | PRN

## 2020-08-31 MED ORDER — CHLORHEXIDINE GLUCONATE 0.12 % MT SOLN
15.0000 mL | OROMUCOSAL | Status: AC
  Administered 2020-08-31: 15 mL via OROMUCOSAL

## 2020-08-31 MED ORDER — MORPHINE SULFATE (PF) 2 MG/ML IV SOLN
1.0000 mg | INTRAVENOUS | Status: DC | PRN
  Administered 2020-08-31 – 2020-09-02 (×4): 2 mg via INTRAVENOUS
  Filled 2020-08-31 (×4): qty 1

## 2020-08-31 MED ORDER — PHENYLEPHRINE HCL (PRESSORS) 10 MG/ML IV SOLN
INTRAVENOUS | Status: AC
  Filled 2020-08-31: qty 1

## 2020-08-31 MED ORDER — ORAL CARE MOUTH RINSE
15.0000 mL | Freq: Two times a day (BID) | OROMUCOSAL | Status: DC
  Administered 2020-09-01 – 2020-09-03 (×5): 15 mL via OROMUCOSAL

## 2020-08-31 MED ORDER — MIDAZOLAM HCL (PF) 10 MG/2ML IJ SOLN
INTRAMUSCULAR | Status: AC
  Filled 2020-08-31: qty 2

## 2020-08-31 MED ORDER — PROPOFOL 10 MG/ML IV BOLUS
INTRAVENOUS | Status: DC | PRN
  Administered 2020-08-31: 100 mg via INTRAVENOUS

## 2020-08-31 MED ORDER — SODIUM CHLORIDE 0.9 % IV SOLN: INTRAVENOUS | Status: DC | PRN

## 2020-08-31 MED ORDER — KETOROLAC TROMETHAMINE 15 MG/ML IJ SOLN
15.0000 mg | Freq: Four times a day (QID) | INTRAMUSCULAR | Status: AC
  Administered 2020-08-31 – 2020-09-01 (×4): 15 mg via INTRAVENOUS
  Filled 2020-08-31 (×3): qty 1

## 2020-08-31 MED ORDER — BUPIVACAINE LIPOSOME 1.3 % IJ SUSP
INTRAMUSCULAR | Status: DC | PRN
  Administered 2020-08-31: 50 mL

## 2020-08-31 MED ORDER — MAGNESIUM SULFATE 4 GM/100ML IV SOLN
4.0000 g | Freq: Once | INTRAVENOUS | Status: AC
  Administered 2020-08-31: 4 g via INTRAVENOUS
  Filled 2020-08-31: qty 100

## 2020-08-31 MED ORDER — SODIUM CHLORIDE 0.9% FLUSH
3.0000 mL | Freq: Two times a day (BID) | INTRAVENOUS | Status: DC
  Administered 2020-09-01 – 2020-09-04 (×7): 3 mL via INTRAVENOUS

## 2020-08-31 MED ORDER — HEPARIN SODIUM (PORCINE) 1000 UNIT/ML IJ SOLN
INTRAMUSCULAR | Status: AC
  Filled 2020-08-31: qty 1

## 2020-08-31 MED ORDER — FENTANYL CITRATE (PF) 250 MCG/5ML IJ SOLN
INTRAMUSCULAR | Status: DC | PRN
  Administered 2020-08-31 (×6): 50 ug via INTRAVENOUS

## 2020-08-31 MED ORDER — SODIUM BICARBONATE 8.4 % IV SOLN
INTRAVENOUS | Status: AC
  Filled 2020-08-31: qty 50

## 2020-08-31 MED ORDER — HEPARIN SODIUM (PORCINE) 1000 UNIT/ML IJ SOLN
INTRAMUSCULAR | Status: DC | PRN
  Administered 2020-08-31: 27000 [IU] via INTRAVENOUS

## 2020-08-31 MED ORDER — VANCOMYCIN HCL 1000 MG IV SOLR
INTRAVENOUS | Status: DC | PRN
  Administered 2020-08-31: 1000 mL

## 2020-08-31 MED ORDER — LACTATED RINGERS IV SOLN: 500.0000 mL | Freq: Once | INTRAVENOUS | Status: DC | PRN

## 2020-08-31 MED ORDER — ACETAMINOPHEN 650 MG RE SUPP
650.0000 mg | Freq: Once | RECTAL | Status: AC
  Administered 2020-08-31: 650 mg via RECTAL

## 2020-08-31 MED ORDER — ALBUMIN HUMAN 5 % IV SOLN
250.0000 mL | INTRAVENOUS | Status: AC | PRN
  Administered 2020-08-31: 12.5 g via INTRAVENOUS

## 2020-08-31 MED ORDER — ONDANSETRON HCL 4 MG/2ML IJ SOLN
INTRAMUSCULAR | Status: AC
  Filled 2020-08-31: qty 2

## 2020-08-31 MED ORDER — SUGAMMADEX SODIUM 200 MG/2ML IV SOLN
INTRAVENOUS | Status: DC | PRN
  Administered 2020-08-31: 200 mg via INTRAVENOUS

## 2020-08-31 MED ORDER — METOPROLOL TARTRATE 12.5 MG HALF TABLET
12.5000 mg | ORAL_TABLET | Freq: Once | ORAL | Status: AC
  Administered 2020-08-31: 12.5 mg via ORAL
  Filled 2020-08-31: qty 1

## 2020-08-31 MED ORDER — FAMOTIDINE IN NACL 20-0.9 MG/50ML-% IV SOLN
20.0000 mg | Freq: Two times a day (BID) | INTRAVENOUS | Status: AC
  Administered 2020-08-31 – 2020-09-01 (×2): 20 mg via INTRAVENOUS
  Filled 2020-08-31 (×3): qty 50

## 2020-08-31 MED ORDER — ACETAMINOPHEN 160 MG/5ML PO SOLN: 650.0000 mg | Freq: Once | ORAL | Status: AC

## 2020-08-31 MED ORDER — VANCOMYCIN HCL IN DEXTROSE 1-5 GM/200ML-% IV SOLN
1000.0000 mg | Freq: Once | INTRAVENOUS | Status: AC
  Administered 2020-08-31: 1000 mg via INTRAVENOUS
  Filled 2020-08-31: qty 200

## 2020-08-31 MED ORDER — PHENYLEPHRINE HCL-NACL 10-0.9 MG/250ML-% IV SOLN
INTRAVENOUS | Status: DC | PRN
  Administered 2020-08-31: 25 ug/min via INTRAVENOUS

## 2020-08-31 MED ORDER — ASPIRIN EC 325 MG PO TBEC: 325.0000 mg | DELAYED_RELEASE_TABLET | Freq: Every day | ORAL | Status: DC

## 2020-08-31 MED ORDER — ONDANSETRON HCL 4 MG/2ML IJ SOLN
4.0000 mg | Freq: Four times a day (QID) | INTRAMUSCULAR | Status: DC | PRN
  Administered 2020-08-31 – 2020-09-01 (×2): 4 mg via INTRAVENOUS
  Filled 2020-08-31 (×3): qty 2

## 2020-08-31 MED ORDER — ROCURONIUM BROMIDE 10 MG/ML (PF) SYRINGE
PREFILLED_SYRINGE | INTRAVENOUS | Status: DC | PRN
  Administered 2020-08-31: 30 mg via INTRAVENOUS
  Administered 2020-08-31: 20 mg via INTRAVENOUS
  Administered 2020-08-31: 30 mg via INTRAVENOUS
  Administered 2020-08-31: 50 mg via INTRAVENOUS

## 2020-08-31 MED ORDER — CALCIUM CHLORIDE 10 % IV SOLN
INTRAVENOUS | Status: AC
  Filled 2020-08-31: qty 10

## 2020-08-31 SURGICAL SUPPLY — 97 items
ADAPTER CARDIO PERF ANTE/RETRO (ADAPTER) ×3 IMPLANT
BAG DECANTER FOR FLEXI CONT (MISCELLANEOUS) ×6 IMPLANT
BLADE SURG 11 STRL SS (BLADE) ×3 IMPLANT
CANISTER SUCT 3000ML PPV (MISCELLANEOUS) ×6 IMPLANT
CANNULA ADULT BIO-MEDICUS 15FR (CANNULA) ×3 IMPLANT
CANNULA FEM VENOUS REMOTE 22FR (CANNULA) ×3 IMPLANT
CANNULA OPTISITE PERFUSION 16F (CANNULA) IMPLANT
CANNULA OPTISITE PERFUSION 18F (CANNULA) ×3 IMPLANT
CANNULA SUMP PERICARDIAL (CANNULA) ×6 IMPLANT
CATH CPB KIT OWEN (MISCELLANEOUS) IMPLANT
CATH KIT ON-Q SILVERSOAK 5IN (CATHETERS) IMPLANT
CELLS DAT CNTRL 66122 CELL SVR (MISCELLANEOUS) ×2 IMPLANT
CLOSURE PERCLOSE PROSTYLE (VASCULAR PRODUCTS) ×12 IMPLANT
CONN ST 1/4X3/8  BEN (MISCELLANEOUS) ×2
CONN ST 1/4X3/8 BEN (MISCELLANEOUS) ×4 IMPLANT
CONNECTOR 1/2X3/8X1/2 3 WAY (MISCELLANEOUS) ×2
CONNECTOR 1/2X3/8X1/2 3WAY (MISCELLANEOUS) ×4 IMPLANT
COVER BACK TABLE 24X17X13 BIG (DRAPES) ×3 IMPLANT
COVER PROBE W GEL 5X96 (DRAPES) ×3 IMPLANT
DERMABOND ADVANCED (GAUZE/BANDAGES/DRESSINGS) ×2
DERMABOND ADVANCED .7 DNX12 (GAUZE/BANDAGES/DRESSINGS) ×4 IMPLANT
DEVICE SUT CK QUICK LOAD INDV (Prosthesis & Implant Heart) ×12 IMPLANT
DEVICE SUT CK QUICK LOAD MINI (Prosthesis & Implant Heart) ×3 IMPLANT
DEVICE TROCAR PUNCTURE CLOSURE (ENDOMECHANICALS) ×3 IMPLANT
DRAIN CHANNEL 32F RND 10.7 FF (WOUND CARE) ×6 IMPLANT
DRAPE C-ARM 42X72 X-RAY (DRAPES) ×3 IMPLANT
DRAPE CV SPLIT W-CLR ANES SCRN (DRAPES) ×3 IMPLANT
DRAPE INCISE IOBAN 66X45 STRL (DRAPES) ×6 IMPLANT
DRAPE PERI GROIN 82X75IN TIB (DRAPES) ×3 IMPLANT
DRAPE SLUSH/WARMER DISC (DRAPES) ×3 IMPLANT
DRSG AQUACEL AG ADV 3.5X10 (GAUZE/BANDAGES/DRESSINGS) ×3 IMPLANT
ELECT BLADE 6.5 EXT (BLADE) ×3 IMPLANT
ELECT REM PT RETURN 9FT ADLT (ELECTROSURGICAL) ×6
ELECTRODE REM PT RTRN 9FT ADLT (ELECTROSURGICAL) ×4 IMPLANT
FELT TEFLON 1X6 (MISCELLANEOUS) ×6 IMPLANT
FEMORAL VENOUS CANN RAP (CANNULA) IMPLANT
GAUZE SPONGE 4X4 12PLY STRL (GAUZE/BANDAGES/DRESSINGS) ×3 IMPLANT
GAUZE SPONGE 4X4 12PLY STRL LF (GAUZE/BANDAGES/DRESSINGS) IMPLANT
GLOVE ORTHO TXT STRL SZ7.5 (GLOVE) ×12 IMPLANT
GLOVE SURG SS PI 7.0 STRL IVOR (GLOVE) ×3 IMPLANT
GLOVE SURG UNDER POLY LF SZ6.5 (GLOVE) ×3 IMPLANT
GOWN STRL REUS W/ TWL LRG LVL3 (GOWN DISPOSABLE) ×10 IMPLANT
GOWN STRL REUS W/TWL LRG LVL3 (GOWN DISPOSABLE) ×5
GRASPER SUT TROCAR 14GX15 (MISCELLANEOUS) ×3 IMPLANT
IV NS 1000ML (IV SOLUTION) ×1
IV NS 1000ML BAXH (IV SOLUTION) ×2 IMPLANT
IV NS IRRIG 3000ML ARTHROMATIC (IV SOLUTION) ×3 IMPLANT
KIT BASIN OR (CUSTOM PROCEDURE TRAY) ×3 IMPLANT
KIT DILATOR VASC 18G NDL (KITS) ×3 IMPLANT
KIT DRAINAGE VACCUM ASSIST (KITS) ×3 IMPLANT
KIT SUCTION CATH 14FR (SUCTIONS) ×3 IMPLANT
KIT SUT CK MINI COMBO 4X17 (Prosthesis & Implant Heart) ×3 IMPLANT
KIT TURNOVER KIT B (KITS) ×3 IMPLANT
LEAD PACING MYOCARDI (MISCELLANEOUS) ×3 IMPLANT
LINE VENT (MISCELLANEOUS) ×3 IMPLANT
NEEDLE AORTIC ROOT 14G 7F (CATHETERS) ×3 IMPLANT
NS IRRIG 1000ML POUR BTL (IV SOLUTION) ×12 IMPLANT
PACK E MIN INVASIVE VALVE (SUTURE) ×3 IMPLANT
PACK OPEN HEART (CUSTOM PROCEDURE TRAY) ×3 IMPLANT
PAD ARMBOARD 7.5X6 YLW CONV (MISCELLANEOUS) ×6 IMPLANT
PAD ELECT DEFIB RADIOL ZOLL (MISCELLANEOUS) ×3 IMPLANT
POSITIONER HEAD DONUT 9IN (MISCELLANEOUS) ×3 IMPLANT
RING MITRAL MEMO 4D 34 (Prosthesis & Implant Heart) ×3 IMPLANT
RTRCTR WOUND ALEXIS 18CM MED (MISCELLANEOUS) ×3
SET CANNULATION TOURNIQUET (MISCELLANEOUS) ×3 IMPLANT
SET CARDIOPLEGIA MPS 5001102 (MISCELLANEOUS) ×3 IMPLANT
SET IRRIG TUBING LAPAROSCOPIC (IRRIGATION / IRRIGATOR) ×3 IMPLANT
SET MICROPUNCTURE 5F STIFF (MISCELLANEOUS) ×3 IMPLANT
SHEATH PINNACLE 8F 10CM (SHEATH) ×9 IMPLANT
SOL ANTI FOG 6CC (MISCELLANEOUS) ×2 IMPLANT
SOLUTION ANTI FOG 6CC (MISCELLANEOUS) ×1
SUT BONE WAX W31G (SUTURE) ×3 IMPLANT
SUT EB EXC GRN/WHT 2-0 D/A SH (SUTURE) ×3
SUT ETHIBOND (SUTURE) ×6 IMPLANT
SUT ETHIBOND 2-0 RB-1 WHT (SUTURE) ×6 IMPLANT
SUT ETHIBOND X763 2 0 SH 1 (SUTURE) ×3 IMPLANT
SUT GORETEX CV 4 TH 22 36 (SUTURE) IMPLANT
SUT GORETEX CV-5THC-13 36IN (SUTURE) ×27 IMPLANT
SUT GORETEX CV4 TH-18 (SUTURE) IMPLANT
SUT PROLENE 3 0 SH1 36 (SUTURE) ×18 IMPLANT
SUT PTFE CHORD X 20MM (SUTURE) ×3 IMPLANT
SUT SILK  1 MH (SUTURE) ×1
SUT SILK 1 MH (SUTURE) ×2 IMPLANT
SUTURE EB EXC GRN/WHT 2-0 D/A (SUTURE) ×2 IMPLANT
SYSTEM SAHARA CHEST DRAIN ATS (WOUND CARE) ×3 IMPLANT
TAPE CLOTH SURG 4X10 WHT LF (GAUZE/BANDAGES/DRESSINGS) ×3 IMPLANT
TAPE PAPER 2X10 WHT MICROPORE (GAUZE/BANDAGES/DRESSINGS) ×3 IMPLANT
TOWEL GREEN STERILE (TOWEL DISPOSABLE) ×3 IMPLANT
TOWEL GREEN STERILE FF (TOWEL DISPOSABLE) ×3 IMPLANT
TRAY FOLEY SLVR 16FR TEMP STAT (SET/KITS/TRAYS/PACK) ×3 IMPLANT
TROCAR XCEL BLADELESS 5X75MML (TROCAR) ×3 IMPLANT
TROCAR XCEL NON-BLD 11X100MML (ENDOMECHANICALS) ×6 IMPLANT
TUBE SUCT INTRACARD DLP 20F (MISCELLANEOUS) ×3 IMPLANT
TUNNELER SHEATH ON-Q 11GX8 DSP (PAIN MANAGEMENT) IMPLANT
UNDERPAD 30X36 HEAVY ABSORB (UNDERPADS AND DIAPERS) ×3 IMPLANT
WATER STERILE IRR 1000ML POUR (IV SOLUTION) ×6 IMPLANT
WIRE EMERALD 3MM-J .035X150CM (WIRE) ×3 IMPLANT

## 2020-08-31 NOTE — Progress Notes (Signed)
  Echocardiogram Echocardiogram Transesophageal has been performed.  Alexa Pennington 08/31/2020, 8:54 AM

## 2020-08-31 NOTE — Hospital Course (Addendum)
[address]       [name] [address]             [phone]    HPI:   Patient is 55 year old female with longstanding history of mitral valve prolapse and mitral regurgitation who has been referred for surgical consultation.   Patient states that she was first told that she had a heart murmur by her pediatrician when she was in junior high school.  Several female family members have had the diagnosis of mitral valve prolapse as well.  She initially had several serial echocardiograms studies as well as Holter monitor for evaluation of palpitations.  She remained clinically stable and went for nearly 20 years without further formal cardiac follow-up.  More recently she has been followed with routine echocardiography.  Transthoracic echocardiograms have reportedly demonstrated the presence of normal left ventricular function with moderately severe and severe mitral regurgitation.  She was seen in follow-up by her cardiologist in [place] and underwent transesophageal echocardiogram which demonstrated myxomatous degenerative disease of the mitral valve with a large flail segment of the posterior leaflet and obvious ruptured chordae tendinae.  There was severe mitral regurgitation and normal left ventricular systolic function.  The patient discussed these findings with her cousin, Dr. Earney Hamburg, and her transesophageal echocardiogram was forwarded to our multidisciplinary heart valve team for review.  The patient was seen in consultation by Dr. [name] earlier today and subsequently underwent coronary CT angiography and was referred for surgical consultation.   Patient is divorced and lives alone in [place], [place].  She has 2 adult children 1 of whom just graduated from [place] of [place] and the other remains in college and runs track at [employer].  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  This  primarily involves working from home.  The patient has remained physically active and entirely functionally independent throughout her adult life.  A year and a half ago she sustained an injury to her knee and that limited how much exercise she had been getting.  However, more recently she has been back walking on a regular basis.  She denies any significant physical limitations although she states that she "feels out of shape".  She does note that if she walks up a flight of stairs she seems to be a little bit short of breath by the time she gets to the top step, but this is mild and does not seem to limit her at all.  She otherwise has no symptoms of exertional shortness of breath or chest discomfort.  She has never had resting shortness of breath, PND, orthopnea, or lower extremity edema.  She states that she has had some palpitations in the past but she is no longer aware of them.  She has never had any dizziness or syncope.  Hospital Course:   On 08/31/2020 Ms. Franchot Gallo underwent a Minimally-Invasive Mitral Valve Repair with Dr. Cornelius Moras. The patient tolerated the procedure well. She was extubaten in the OR at the conclusion of the procedure and was transferred to the PACU in stable condition.  She remained hemodynamically stable.  By the morning of first postoperative day she was mobilized to the bedside chair.  She was in a stable sinus rhythm and having minimal chest tube drainage.  Anticoagulation with Coumadin was begun.  The INR was monitored daily.  She was felt surgically stable for transfer from the ICU to 4E on 02/12.  She is ambulating on room air with good oxygenation. Epicardial pacing wires and chest tubes were removed on 02/13. She was volume overloaded and diuresed. She had expected post op blood loss anemia. Her last H and H was stable at 10.4 and 31.4. Her wounds are clean, dry, and healing without signs of infection. On 02/14, her INR is ** and she is on 2.5 mg of Coumadin daily. PT/INR  will be checked within 48 hours after discharge. Patient is felt surgically stable for discharge today.  Marland Kitchen

## 2020-08-31 NOTE — Anesthesia Procedure Notes (Signed)
Central Venous Catheter Insertion Performed by: Kipp Brood, MD Start/End2/04/2021 6:50 AM, 08/31/2020 7:00 AM Preanesthetic checklist: patient identified, IV checked, site marked, surgical consent and pre-op evaluation Position: supine Catheter size: 7 Fr Central line was placed.Triple lumen Procedure performed without using ultrasound guided technique. Attempts: 1 Following insertion, line sutured, dressing applied and Biopatch. Post procedure assessment: blood return through all ports, free fluid flow and no air

## 2020-08-31 NOTE — Transfer of Care (Signed)
Immediate Anesthesia Transfer of Care Note  Patient: Alexa Pennington  Procedure(s) Performed: MINIMALLY INVASIVE MITRAL VALVE REPAIR (MVR) USING MEMO 4D RING SIZE (Right Chest) TRANSESOPHAGEAL ECHOCARDIOGRAM (TEE) (N/A )  Patient Location: ICU  Anesthesia Type:General  Level of Consciousness: awake and drowsy  Airway & Oxygen Therapy: Patient Spontanous Breathing and Patient connected to face mask oxygen  Post-op Assessment: Report given to RN, Post -op Vital signs reviewed and stable and Patient moving all extremities X 4  Post vital signs: Reviewed and stable  Last Vitals:  Vitals Value Taken Time  BP 135/65 08/31/20 1340  Temp    Pulse 71 08/31/20 1344  Resp 18 08/31/20 1344  SpO2 100 % 08/31/20 1344  Vitals shown include unvalidated device data.  Last Pain:  Vitals:   08/31/20 0615  PainSc: 0-No pain      Patients Stated Pain Goal: 3 (08/31/20 0615)  Complications: No complications documented.

## 2020-08-31 NOTE — Anesthesia Procedure Notes (Signed)
Central Venous Catheter Insertion Performed by: Kipp Brood, MD, anesthesiologist Start/End2/04/2021 6:50 AM, 08/31/2020 7:00 AM Patient location: Pre-op. Preanesthetic checklist: patient identified, IV checked, site marked, risks and benefits discussed, surgical consent, monitors and equipment checked, pre-op evaluation, timeout performed and anesthesia consent Lidocaine 1% used for infiltration and patient sedated Hand hygiene performed  and maximum sterile barriers used  Catheter size: 8.5 Fr Sheath introducer Procedure performed using ultrasound guided technique. Ultrasound Notes:anatomy identified, needle tip was noted to be adjacent to the nerve/plexus identified, no ultrasound evidence of intravascular and/or intraneural injection and image(s) printed for medical record Attempts: 1 Following insertion, line sutured and dressing applied. Post procedure assessment: blood return through all ports, free fluid flow and no air  Patient tolerated the procedure well with no immediate complications.

## 2020-08-31 NOTE — Interval H&P Note (Signed)
History and Physical Interval Note:  08/31/2020 5:50 AM  Alexa Pennington  has presented today for surgery, with the diagnosis of MR.  The various methods of treatment have been discussed with the patient and family. After consideration of risks, benefits and other options for treatment, the patient has consented to  Procedure(s): MINIMALLY INVASIVE MITRAL VALVE REPAIR (MVR) (Right) TRANSESOPHAGEAL ECHOCARDIOGRAM (TEE) (N/A) as a surgical intervention.  The patient's history has been reviewed, patient examined, no change in status, stable for surgery.  I have reviewed the patient's chart and labs.  Questions were answered to the patient's satisfaction.     Purcell Nails

## 2020-08-31 NOTE — Anesthesia Procedure Notes (Signed)
Arterial Line Insertion Start/End2/04/2021 7:00 AM, 08/31/2020 7:05 AM Performed by: Nils Pyle, CRNA, CRNA  Patient location: Pre-op. Preanesthetic checklist: patient identified, IV checked, site marked, risks and benefits discussed, surgical consent, monitors and equipment checked, pre-op evaluation and anesthesia consent Lidocaine 1% used for infiltration and patient sedated Left, radial was placed Catheter size: 20 G Hand hygiene performed  and maximum sterile barriers used  Allen's test indicative of satisfactory collateral circulation Attempts: 1 Procedure performed without using ultrasound guided technique. Ultrasound Notes:anatomy identified, needle tip was noted to be adjacent to the nerve/plexus identified and no ultrasound evidence of intravascular and/or intraneural injection Following insertion, dressing applied and Biopatch. Post procedure assessment: normal  Patient tolerated the procedure well with no immediate complications.

## 2020-08-31 NOTE — Op Note (Signed)
CARDIOTHORACIC SURGERY OPERATIVE NOTE  Date of Procedure:  08/31/2020  Preoperative Diagnosis: Severe Mitral Regurgitation  Postoperative Diagnosis: Same  Procedure:    Minimally-Invasive Mitral Valve Repair  Complex valvuloplasty including triangular resection of posterior leaflet  Artificial Gore-tex neochord placement x6  Sorin Memo 4D Ring Annuloplasty (size 40mm, catalog # [id number], serial # [id number])    Surgeon: Salvatore Decent. 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                BRIEF CLINICAL NOTE AND INDICATIONS FOR SURGERY  Patient is 55 year old female with longstanding history of mitral valve prolapse and mitral regurgitation who has been referred for surgical consultation.  Patient states that she was first told that she had a heart murmur by her pediatrician when she was in junior high school. Several female family members have had the diagnosis of mitral valve prolapse as well. She initially had several serial echocardiograms studies as well as Holter monitor for evaluation of palpitations. She remained clinically stable and went for nearly 20 years without further formal cardiac follow-up. More recently she has been followed with routine echocardiography. Transthoracic echocardiograms have reportedly demonstrated the presence of normal left ventricular function with moderately severe and severe mitral regurgitation. She was seen in follow-up by her cardiologist in [place] and underwent transesophageal echocardiogram which demonstrated  myxomatous degenerative disease of the mitral valve with a large flail segment of the posterior leaflet and obvious ruptured chordae tendinae.There was severe mitral regurgitation and normal left ventricular systolic function. The patient discussed these findings with her cousin, Dr. Earney Hamburg, and her transesophageal echocardiogram was forwarded to our multidisciplinary heart valve team for review. The patient was seen in consultation by Dr. [name] earlier today and subsequently underwent coronary CT angiography and was referred for surgical consultation.  The patient has been seen in consultation and counseled at length regarding the indications, risks and potential benefits of surgery.  All questions have been answered, and the patient provides full informed consent for the operation as described.    DETAILS OF THE OPERATIVE PROCEDURE  Preparation:  The patient is brought to the operating room on the above mentioned date and central monitoring was established by the anesthesia team including placement of Swan-Ganz catheter through the left internal jugular vein.  A radial arterial line is placed. The patient is placed in the supine position on the operating table.  Intravenous antibiotics are administered. General endotracheal anesthesia is induced uneventfully. The patient is initially intubated using a dual lumen endotracheal tube.  A Foley catheter is placed.  Baseline transesophageal echocardiogram was performed.  Findings were notable for myxomatous degenerative disease of the mitral valve with severe mitral regurgitation.  There was an obvious large flail segment involving the middle scallop of the posterior leaflet with multiple ruptured primary chordae tendinae.  There was severe mitral regurgitation.  There was normal left ventricular function.  Left atrium was dilated.  There was normal right ventricular size and function.  There was trivial tricuspid regurgitation.  A soft roll  is placed behind the patient's left scapula and the neck gently extended and turned to the left.  The patient's right neck, chest, abdomen, both groins, and both lower extremities are prepared and draped in a sterile manner. A time out procedure is performed.   Percutaneous Vascular Access:  Percutaneous arterial and venous access were obtained on the right side.  Using ultrasound guidance the right common femoral vein was cannulated using the Seldinger technique a pair of Perclose vascular closure devises were placed at opposing 30 degree angles in the femoral vein, after which time an 8 French sheath inserted.  The right common femoral artery was cannulated using a micropuncture wire and sheath.  A pair of Perclose vascular closure devices were placed at opposing 30 degree angles in the femoral artery, and a 8 French sheath inserted.  The right internal jugular vein was cannulated  using ultrasound guidance and an 8 French sheath inserted.     Surgical Approach:  A right miniature anterolateral thoracotomy incision is performed. The incision is placed just lateral to and superior to the right nipple. The pectoralis major muscle is retracted medially and completely preserved. The right pleural space is entered through the 3rd intercostal space. A soft tissue retractor is placed.  Two 11 mm ports are placed through separate stab incisions inferiorly. The right pleural space is insufflated continuously with carbon dioxide gas through the posterior port during the remainder of the operation.  A pledgeted sutures placed through the dome of the right hemidiaphragm and retracted inferiorly to facilitate exposure.  A longitudinal incision is made in the pericardium 3 cm anterior to the phrenic nerve and silk traction sutures are placed on either side of the incision for exposure.   Extracorporeal Cardiopulmonary Bypass and Myocardial Protection:   The patient was heparinized systemically.  The right common  femoral vein is cannulated through the venous sheath and a guidewire advanced into the right atrium using TEE guidance.  The femoral vein cannulated using a 22 French long femoral venous cannula.  The right common femoral artery is cannulated through the arterial sheath and a guidewire advanced into the descending thoracic aorta using TEE guidance.  Femoral artery is cannulated with a 18 French femoral arterial cannula.  The right internal jugular vein is cannulated through the venous sheath and a guidewire advanced into the right atrium.  The internal jugular vein is cannulated using a 15 [place] pediatric femoral venous cannula.   Adequate heparinization is verified.   The entire pre-bypass portion of the operation was notable for stable hemodynamics.  Cardiopulmonary bypass was begun.  Vacuum assist venous drainage is utilized. The incision in the pericardium is extended in both directions. Venous drainage and exposure are notably excellent. A retrograde cardioplegia cannula is placed through the right atrium into the coronary sinus using transesophageal echocardiogram guidance.  An antegrade cardioplegia cannula is placed in the ascending aorta.    The patient is cooled to 32C systemic temperature.  The aortic cross clamp is applied and cardioplegia is delivered initially in an antegrade fashion through the aortic root using modified [place] cold blood cardioplegia ([facility] blood cardioplegia protocol).   The initial cardioplegic arrest is rapid with early diastolic arrest.   Myocardial protection was felt to be excellent.   Mitral Valve Repair:  A left atriotomy incision was performed through the interatrial groove and extended partially across the back wall of the left atrium after opening the oblique sinus inferiorly.  The mitral valve is exposed using a self-retaining retractor.  The mitral valve was inspected and notable for forme fruste variant of Barlow's type myxomatous  degenerative  disease.  There was a very large prolapsing middle scallop (P2) of the posterior leaflet with large flail segment and multiple ruptured primary chordae tendinae.  The valve was relatively large in size.  There was no prolapse involving the anterior leaflet.  There was no calcification.  A generous triangular resection of the prolapsing middle scallop (P2) of the posterior leaflet was performed.  Approximately 30% of the surface area of P2 was resected.  The intervening vertical defect was closed using interrupted everting CV 5 Gore-Tex suture.  Artificial neochord placement was performed using Chord-X multi-strand CV-4 Goretex pre-measured loops.  The appropriate cord length (83mm) was measured from corresponding normal length primary cords from the P1 segment of the posterior leaflet. The papillary muscle suture of a Chord-X multi-strand suture was placed through the head of the anterior papillary muscle in a horizontal mattress fashion and tied over Teflon felt pledgets. Each of the three pre-measured loops were then reimplanted into the free margin of the P2 segment of the posterior leaflet on the anterior side of midline.  The valve was tested with saline and appeared competent.  Interrupted 2-0 Ethibond horizontal mattress sutures are placed circumferentially around the entire mitral valve annulus.  The valve was again tested with saline and appeared competent even without ring annuloplasty complete. The valve was sized to a 34 mm annuloplasty ring, based upon the transverse distance between the left and right commissures and the height of the anterior leaflet, corresponding to a size just slightly larger than the overall surface area of the anterior leaflet.  A Sorin Memo 4D annuloplasty ring (size 85mm, catalog #[id number], serial [id number]) was secured in place uneventfully. All ring sutures were secured using a Cor-knot device.    The valve was tested with saline and appeared competent. There is no  residual leak. There was a broad, symmetrical line of coaptation of the anterior and posterior leaflet which was confirmed using the blue ink test.  Rewarming is begun.   Procedure Completion:  The atriotomy was closed using a 2-layer closure of running 3-0 Prolene suture after placing a sump drain across the mitral valve to serve as a left ventricular vent.  One final dose of warm retrograde "reanimation dose" cardioplegia was administered retrograde through the coronary sinus catheter while all air was evacuated through the aortic root.  The aortic cross clamp was removed after a total cross clamp time of 99 minutes.  Epicardial pacing wires are fixed to the inferior wall of the right ventricule and to the right atrial appendage. The patient is rewarmed to 37C temperature. The left ventricular vent and antegrade cardioplegia cannula are removed.  The pericardial sac was drained using a 32 French Bard drain placed through the anterior port incision. The patient is weaned and disconnected from cardiopulmonary bypass.  The patient's rhythm at separation from bypass was sinus.  The patient was weaned from bypass without any inotropic support. Total cardiopulmonary bypass time for the operation was 137 minutes.  Followup transesophageal echocardiogram performed after separation from bypass revealed  a well-seated annuloplasty ring in the mitral position with a normal functioning mitral valve. There was trivial residual mitral regurgitation.  Left ventricular function was unchanged from preoperatively.    The femoral arterial and venous cannulas were removed and all Perclose sutures secured.  Manual pressure was maintained while Protamine was administered.  The right internal jugular cannula was removed and manual pressure held on the neck and groin for 15 minutes.  Single lung ventilation  was begun. The atriotomy closure was inspected for hemostasis.  The right pleural space is irrigated with saline  solution and inspected for hemostasis.   A mixture of Exparel liposomal bupivacaine (20 mL) and 0.5% bupivacaine (30 mL) is utilized to create an intercostal nerve block for postoperative analgesia.  The mixture is injected under direct vision into the intercostal neurovascular bundles posteriorly to cover the second through the sixth intercostal nerve roots.  Portions of the solution are also injected into the intercostal neurovascular bundles immediately surrounding the surgical incision and immediately adjacent to the chest tube exit sites.  The right pleural space was drained using a 32 French Bard drain placed through the posterior port incision. The miniature thoracotomy incision was closed in multiple layers in routine fashion.   The post-bypass portion of the operation was notable for stable rhythm and hemodynamics.  No blood products were administered during the operation.   Disposition:  The patient tolerated the procedure well.  The patient was extubated in the operating room and subsequently transported to the surgical intensive care unit in stable condition. There were no intraoperative complications. All sponge instrument and needle counts are verified correct at completion of the operation.     Salvatore Decent. Cornelius Moras MD 08/31/2020 12:38 PM

## 2020-08-31 NOTE — Progress Notes (Signed)
[facility] Evening Rounds  DOS s/p MVr No complaints Minimal bleeding Good UOP HD stable BP 113/62   Pulse 77   Temp 98.96 F (37.2 C)   Resp 17   Ht 5\' 7"  (1.702 m)   Wt 76.3 kg   SpO2 100%   BMI 27.05 kg/m  NAD CTA RRR   Intake/Output Summary (Last 24 hours) at 08/31/2020 1801 Last data filed at 08/31/2020 1700 Gross per 24 hour  Intake 3704.77 ml  Output 3203 ml  Net 501.77 ml   A/P: doing well Continue early, routine postop care. Virgilia Quigg Z. 10/29/2020, MD [phone]

## 2020-08-31 NOTE — Anesthesia Procedure Notes (Signed)
Procedure Name: Intubation Date/Time: 08/31/2020 8:04 AM Performed by: Nils Pyle, CRNA Pre-anesthesia Checklist: Patient identified, Emergency Drugs available, Suction available and Patient being monitored Patient Re-evaluated:Patient Re-evaluated prior to induction Oxygen Delivery Method: Circle System Utilized Preoxygenation: Pre-oxygenation with 100% oxygen Induction Type: IV induction Ventilation: Mask ventilation without difficulty and Oral airway inserted - appropriate to patient size Laryngoscope Size: Hyacinth Meeker and 2 Grade View: Grade I Endobronchial tube: Double lumen EBT, EBT position confirmed by auscultation and EBT position confirmed by fiberoptic bronchoscope and 37 Fr Number of attempts: 1 Airway Equipment and Method: Stylet and Oral airway Placement Confirmation: ETT inserted through vocal cords under direct vision,  positive ETCO2 and breath sounds checked- equal and bilateral Tube secured with: Tape Dental Injury: Teeth and Oropharynx as per pre-operative assessment

## 2020-08-31 NOTE — Anesthesia Preprocedure Evaluation (Signed)
Anesthesia Evaluation  Patient identified by MRN, date of birth, ID band Patient awake    Reviewed: Allergy & Precautions, NPO status , Patient's Chart, lab work & pertinent test results  Airway Mallampati: II  TM Distance: >3 FB Neck ROM: Full    Dental  (+) Teeth Intact, Dental Advisory Given   Pulmonary    breath sounds clear to auscultation       Cardiovascular  Rhythm:Regular Rate:Normal + Systolic murmurs    Neuro/Psych    GI/Hepatic   Endo/Other    Renal/GU      Musculoskeletal   Abdominal   Peds  Hematology   Anesthesia Other Findings   Reproductive/Obstetrics                             Anesthesia Physical Anesthesia Plan  ASA: III  Anesthesia Plan: General   Post-op Pain Management:    Induction: Intravenous  PONV Risk Score and Plan: Dexamethasone and Ondansetron  Airway Management Planned: Double Lumen EBT  Additional Equipment: Arterial line, CVP, 3D TEE and Ultrasound Guidance Line Placement  Intra-op Plan:   Post-operative Plan: Possible Post-op intubation/ventilation  Informed Consent: I have reviewed the patients History and Physical, chart, labs and discussed the procedure including the risks, benefits and alternatives for the proposed anesthesia with the patient or authorized representative who has indicated his/her understanding and acceptance.     Dental advisory given  Plan Discussed with: CRNA and Anesthesiologist  Anesthesia Plan Comments:         Anesthesia Quick Evaluation

## 2020-09-01 ENCOUNTER — Encounter (HOSPITAL_COMMUNITY): Payer: Self-pay | Admitting: Thoracic Surgery (Cardiothoracic Vascular Surgery)

## 2020-09-01 ENCOUNTER — Inpatient Hospital Stay (HOSPITAL_COMMUNITY): Payer: Federal, State, Local not specified - PPO

## 2020-09-01 DIAGNOSIS — I340 Nonrheumatic mitral (valve) insufficiency: Secondary | ICD-10-CM | POA: Diagnosis not present

## 2020-09-01 DIAGNOSIS — Z006 Encounter for examination for normal comparison and control in clinical research program: Secondary | ICD-10-CM | POA: Diagnosis not present

## 2020-09-01 DIAGNOSIS — D62 Acute posthemorrhagic anemia: Secondary | ICD-10-CM | POA: Diagnosis not present

## 2020-09-01 DIAGNOSIS — I511 Rupture of chordae tendineae, not elsewhere classified: Secondary | ICD-10-CM | POA: Diagnosis not present

## 2020-09-01 LAB — GLUCOSE, CAPILLARY
Glucose-Capillary: 113 mg/dL — ABNORMAL HIGH (ref 70–99)
Glucose-Capillary: 115 mg/dL — ABNORMAL HIGH (ref 70–99)
Glucose-Capillary: 117 mg/dL — ABNORMAL HIGH (ref 70–99)
Glucose-Capillary: 123 mg/dL — ABNORMAL HIGH (ref 70–99)
Glucose-Capillary: 127 mg/dL — ABNORMAL HIGH (ref 70–99)
Glucose-Capillary: 129 mg/dL — ABNORMAL HIGH (ref 70–99)
Glucose-Capillary: 134 mg/dL — ABNORMAL HIGH (ref 70–99)
Glucose-Capillary: 99 mg/dL (ref 70–99)

## 2020-09-01 LAB — BASIC METABOLIC PANEL
Anion gap: 8 (ref 5–15)
Anion gap: 10 (ref 5–15)
BUN: 6 mg/dL (ref 6–20)
BUN: 6 mg/dL (ref 6–20)
CO2: 22 mmol/L (ref 22–32)
CO2: 22 mmol/L (ref 22–32)
Calcium: 7.7 mg/dL — ABNORMAL LOW (ref 8.9–10.3)
Calcium: 7.9 mg/dL — ABNORMAL LOW (ref 8.9–10.3)
Chloride: 105 mmol/L (ref 98–111)
Chloride: 103 mmol/L (ref 98–111)
Creatinine, Ser: 0.66 mg/dL (ref 0.44–1.00)
Creatinine, Ser: 0.77 mg/dL (ref 0.44–1.00)
GFR, Estimated: >60 mL/min (ref >60)
GFR, Estimated: >60 mL/min (ref >60)
Glucose, Bld: 116 mg/dL — ABNORMAL HIGH (ref 70–99)
Glucose, Bld: 157 mg/dL — ABNORMAL HIGH (ref 70–99)
Potassium: 3.7 mmol/L (ref 3.5–5.1)
Potassium: 3.3 mmol/L — ABNORMAL LOW (ref 3.5–5.1)
Sodium: 135 mmol/L (ref 135–145)
Sodium: 135 mmol/L (ref 135–145)

## 2020-09-01 LAB — CBC
HCT: 29.8 % — ABNORMAL LOW (ref 36.0–46.0)
HCT: 30 % — ABNORMAL LOW (ref 36.0–46.0)
Hemoglobin: 9.8 g/dL — ABNORMAL LOW (ref 12.0–15.0)
Hemoglobin: 9.8 g/dL — ABNORMAL LOW (ref 12.0–15.0)
MCH: 31.5 pg (ref 26.0–34.0)
MCH: 31.3 pg (ref 26.0–34.0)
MCHC: 32.9 g/dL (ref 30.0–36.0)
MCHC: 32.7 g/dL (ref 30.0–36.0)
MCV: 95.8 fL (ref 80.0–100.0)
MCV: 95.8 fL (ref 80.0–100.0)
Platelets: 156 10*3/uL (ref 150–400)
Platelets: 166 10*3/uL (ref 150–400)
RBC: 3.11 MIL/uL — ABNORMAL LOW (ref 3.87–5.11)
RBC: 3.13 MIL/uL — ABNORMAL LOW (ref 3.87–5.11)
RDW: 12.3 % (ref 11.5–15.5)
RDW: 12.6 % (ref 11.5–15.5)
WBC: 11.1 10*3/uL — ABNORMAL HIGH (ref 4.0–10.5)
WBC: 12.2 10*3/uL — ABNORMAL HIGH (ref 4.0–10.5)
nRBC: 0 % (ref 0.0–0.2)
nRBC: 0 % (ref 0.0–0.2)

## 2020-09-01 LAB — SURGICAL PATHOLOGY

## 2020-09-01 LAB — MAGNESIUM
Magnesium: 2.5 mg/dL — ABNORMAL HIGH (ref 1.7–2.4)
Magnesium: 2.4 mg/dL (ref 1.7–2.4)

## 2020-09-01 MED ORDER — POTASSIUM CHLORIDE 10 MEQ/50ML IV SOLN
10.0000 meq | INTRAVENOUS | Status: AC
  Administered 2020-09-01 (×3): 10 meq via INTRAVENOUS
  Filled 2020-09-01 (×3): qty 50

## 2020-09-01 MED ORDER — COUMADIN BOOK
Freq: Once | Status: AC
  Filled 2020-09-01: qty 1

## 2020-09-01 MED ORDER — ASPIRIN EC 325 MG PO TBEC
325.0000 mg | DELAYED_RELEASE_TABLET | Freq: Every day | ORAL | Status: AC
  Administered 2020-09-01: 325 mg via ORAL
  Filled 2020-09-01: qty 1

## 2020-09-01 MED ORDER — POTASSIUM CHLORIDE CRYS ER 20 MEQ PO TBCR
20.0000 meq | EXTENDED_RELEASE_TABLET | ORAL | Status: DC
Start: 2020-09-01 — End: 2020-09-01

## 2020-09-01 MED ORDER — WARFARIN SODIUM 2.5 MG PO TABS
2.5000 mg | ORAL_TABLET | Freq: Every day | ORAL | Status: DC
  Administered 2020-09-01 – 2020-09-02 (×2): 2.5 mg via ORAL
  Filled 2020-09-01 (×2): qty 1

## 2020-09-01 MED ORDER — ENOXAPARIN SODIUM 40 MG/0.4ML ~~LOC~~ SOLN
40.0000 mg | Freq: Every day | SUBCUTANEOUS | Status: DC
  Administered 2020-09-02 – 2020-09-03 (×2): 40 mg via SUBCUTANEOUS
  Filled 2020-09-01 (×2): qty 0.4

## 2020-09-01 MED ORDER — WARFARIN - PHYSICIAN DOSING INPATIENT: Freq: Every day | Status: DC

## 2020-09-01 MED ORDER — FUROSEMIDE 10 MG/ML IJ SOLN
20.0000 mg | Freq: Two times a day (BID) | INTRAMUSCULAR | Status: AC
  Administered 2020-09-01 – 2020-09-02 (×3): 20 mg via INTRAVENOUS
  Filled 2020-09-01 (×5): qty 2

## 2020-09-01 MED ORDER — INSULIN ASPART 100 UNIT/ML ~~LOC~~ SOLN
0.0000 [IU] | SUBCUTANEOUS | Status: DC
  Administered 2020-09-01: 2 [IU] via SUBCUTANEOUS

## 2020-09-01 MED ORDER — POTASSIUM CHLORIDE 10 MEQ/50ML IV SOLN
10.0000 meq | INTRAVENOUS | Status: AC
  Administered 2020-09-01 – 2020-09-02 (×6): 10 meq via INTRAVENOUS
  Filled 2020-09-01 (×6): qty 50

## 2020-09-01 MED ORDER — ASPIRIN EC 81 MG PO TBEC
81.0000 mg | DELAYED_RELEASE_TABLET | Freq: Every day | ORAL | Status: DC
  Administered 2020-09-02 – 2020-09-04 (×3): 81 mg via ORAL
  Filled 2020-09-01 (×3): qty 1

## 2020-09-01 MED ORDER — PHENYLEPHRINE HCL-NACL 10-0.9 MG/250ML-% IV SOLN
INTRAVENOUS | Status: AC
  Filled 2020-09-01: qty 250

## 2020-09-01 MED ORDER — LEVONORGESTREL-ETHINYL ESTRAD 0.15-30 MG-MCG PO TABS
1.0000 | ORAL_TABLET | Freq: Every day | ORAL | Status: DC
  Administered 2020-09-01 – 2020-09-04 (×4): 1 via ORAL

## 2020-09-01 NOTE — Progress Notes (Signed)
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 Patient extubated in OR. Now awake and alert in good spirits, neuro intact, walking with assistance, minimal pain.  VS: T- 36.8 BP- 115/58 HR- 71 (SR) RR-22 O2 Sat 97% on RA  K- 3.7 BUN/Cr.- 6/0.66 glucose- 116 H/H- 9.8/29.8 platelets- 156,000  CXR: minimal L. basilar atelectasis, lungs otherwise expanded without airspace disease.  Impression: Doing well post-op day #1. No apparent complications.   Kipp Brood

## 2020-09-01 NOTE — Anesthesia Postprocedure Evaluation (Signed)
Anesthesia Post Note  Patient: Alexa Pennington  Procedure(s) Performed: MINIMALLY INVASIVE MITRAL VALVE REPAIR (MVR) USING MEMO 4D RING SIZE (Right Chest) TRANSESOPHAGEAL ECHOCARDIOGRAM (TEE) (N/A )     Patient location during evaluation: SICU Anesthesia Type: General Level of consciousness: awake and alert, oriented and patient cooperative Pain management: pain level controlled Respiratory status: spontaneous breathing, nonlabored ventilation and respiratory function stable Cardiovascular status: blood pressure returned to baseline Postop Assessment: no headache, no apparent nausea or vomiting and adequate PO intake Anesthetic complications: no   No complications documented.  Last Vitals:  Vitals:   09/01/20 1553 09/01/20 1600  BP:  (!) 110/55  Pulse:    Resp:  20  Temp: 36.8 C   SpO2:      Last Pain:  Vitals:   09/01/20 1000  TempSrc:   PainSc: 5                  Vienne Corcoran COKER

## 2020-09-01 NOTE — Plan of Care (Signed)
  Problem: Education: Goal: Knowledge of the prescribed therapeutic regimen will improve Outcome: Progressing   Problem: Activity: Goal: Risk for activity intolerance will decrease Outcome: Progressing   Problem: Cardiac: Goal: Will achieve and/or maintain hemodynamic stability Outcome: Progressing   Problem: Respiratory: Goal: Respiratory status will improve Outcome: Progressing   Problem: Urinary Elimination: Goal: Ability to achieve and maintain adequate renal perfusion and functioning will improve Outcome: Progressing

## 2020-09-01 NOTE — Progress Notes (Addendum)
TCTS DAILY ICU PROGRESS NOTE                   [address]            [name] [address]          [phone]   1 Day Post-Op Procedure(s) (LRB): MINIMALLY INVASIVE MITRAL VALVE REPAIR (MVR) USING MEMO 4D RING SIZE (Right) TRANSESOPHAGEAL ECHOCARDIOGRAM (TEE) (N/A)  Total Length of Stay:  LOS: 1 day   Subjective: Up in the bedside chair.  Awake and alert.  She commented that she is having very little pain despite taking normal analgesics overnight.  She has no complaints or concerns.  Objective: Vital signs in last 24 hours: Temp:  [96.44 F (35.8 C)-98.96 F (37.2 C)] 98.2 F (36.8 C) (02/11 0753) Pulse Rate:  [65-77] 69 (02/11 0700) Resp:  [7-30] 20 (02/11 0700) BP: (97-121)/(55-69) 114/63 (02/11 0700) SpO2:  [98 %-100 %] 98 % (02/11 0700) Arterial Line BP: (10-141)/(1-81) 10/1 (02/11 0300) Weight:  [80.5 kg] 80.5 kg (02/11 0600)  Filed Weights   08/30/20 0900 09/01/20 0600  Weight: 76.3 kg 80.5 kg    Weight change: 4.2 kg   Hemodynamic parameters for last 24 hours:    Intake/Output from previous day: 02/10 0701 - 02/11 0700 In: 5193.9 [I.V.:3437.6; Blood:244; IV Piggyback:1512.2] Out: 4333 [Urine:3545; Blood:418; Chest Tube:370]  Intake/Output this shift: No intake/output data recorded.  Current Meds: Scheduled Meds: . acetaminophen  1,000 mg Oral Q6H  . aspirin EC  325 mg Oral Daily  . [START ON 09/02/2020] aspirin EC  81 mg Oral Daily  . bisacodyl  10 mg Oral Daily   Or  . bisacodyl  10 mg Rectal Daily  . chlorhexidine  15 mL Mouth Rinse BID  . Chlorhexidine Gluconate Cloth  6 each Topical Daily  . docusate sodium  200 mg Oral Daily  . [START ON 09/02/2020] enoxaparin (LOVENOX) injection  40 mg Subcutaneous QHS  . furosemide  20 mg Intravenous BID  . insulin aspart  0-24 Units Subcutaneous Q4H  . ketorolac  15 mg Intravenous Q6H  . mouth rinse  15 mL Mouth Rinse q12n4p  . [START ON 09/02/2020] pantoprazole  40 mg Oral Daily  .  sodium chloride flush  3 mL Intravenous Q12H  . warfarin  2.5 mg Oral q1600  . Warfarin - Physician Dosing Inpatient   Does not apply q1600   Continuous Infusions: . sodium chloride    . albumin human 12.5 g (08/31/20 1500)  . cefUROXime (ZINACEF)  IV Stopped (08/31/20 2242)  . lactated ringers    . lactated ringers    . potassium chloride 10 mEq (09/01/20 0746)   PRN Meds:.albumin human, metoprolol tartrate, morphine injection, ondansetron (ZOFRAN) IV, oxyCODONE, sodium chloride flush, traMADol  General appearance: alert, cooperative and no distress Neurologic: intact Heart: regular rate and rhythm Lungs: Breath sounds are clear but shallow.  Expected left basilar atelectasis on chest x-ray.  Chest tube drainage is moderate and tapering off. Abdomen: Soft and nontender Extremities: All are well perfused.  No detectable peripheral edema.  The right femoral cannulation sites are soft and dry. Wound: The right chest wound is covered with a dry dressing.  Lab Results: CBC: Recent Labs    08/31/20 1949 09/01/20 0320  WBC 9.6 11.1*  HGB 10.9* 9.8*  HCT 31.3* 29.8*  PLT 170 156   BMET:  Recent Labs    08/31/20 1949 09/01/20 0320  NA 137 135  K 3.8 3.7  CL 106 105  CO2 19* 22  GLUCOSE 140* 116*  BUN 7 6  CREATININE 0.67 0.66  CALCIUM 7.4* 7.7*    CMET: Lab Results  Component Value Date   WBC 11.1 (H) 09/01/2020   HGB 9.8 (L) 09/01/2020   HCT 29.8 (L) 09/01/2020   PLT 156 09/01/2020   GLUCOSE 116 (H) 09/01/2020   ALT 14 08/30/2020   AST 18 08/30/2020   NA 135 09/01/2020   K 3.7 09/01/2020   CL 105 09/01/2020   CREATININE 0.66 09/01/2020   BUN 6 09/01/2020   CO2 22 09/01/2020   INR 1.3 (H) 08/31/2020   HGBA1C 5.1 08/30/2020      PT/INR:  Recent Labs    08/31/20 1403  LABPROT 15.9*  INR 1.3*   Radiology: [facility] Chest Port 1 View  Result Date: 09/01/2020 CLINICAL DATA:  Chest pain.  Chest tube present EXAM: PORTABLE CHEST 1 VIEW COMPARISON:  August 31, 2020 FINDINGS: Left jugular catheter tip is in the left innominate vein region. There is a chest tube on the right. Temporary pacemaker wires are attached to the right heart. No pneumothorax. There is atelectatic change in the left base. Lungs elsewhere clear. Heart is mildly enlarged with pulmonary vascularity normal. Mitral valve replacement noted. No adenopathy. No bone lesions. IMPRESSION: Tube and catheter positions as described without pneumothorax. Left base atelectasis. No edema or airspace opacity. Stable cardiac prominence. Electronically Signed   By: Bretta Bang III M.D.   On: 09/01/2020 07:56   DG Chest Port 1 View  Result Date: 08/31/2020 CLINICAL DATA:  Status post mitral valve repair EXAM: PORTABLE CHEST 1 VIEW COMPARISON:  None. FINDINGS: Left internal jugular central venous catheter tip overlies the expected confluence of the left subclavian and left internal jugular veins. Lung volumes are small with right mid and lower lung zone opacity likely related to atelectasis though an asymmetric focal pulmonary infiltrate is difficult to exclude. No pneumothorax or pleural effusion. Mitral valve replacement has been performed. Cardiac size is mildly enlarged. Left basilar chest tube is in place. Pulmonary vascularity is normal. Surgical clips are seen within the right axilla. IMPRESSION: Pulmonary hypoinflation. Asymmetric opacity within the right mid lung zone likely representing asymmetric atelectasis though asymmetric pulmonary infiltrate could appear similarly. Interval mitral valve replacement.  Mild cardiomegaly. Left internal jugular central venous catheter tip within the probable confluence of the brachiocephalic vein. 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  Hemodynamically stable since surgery and not requiring any vasoactive infusions.. Begin diuresis.  Mobilize.  Swan-Ganz catheter and Cordis have been removed.  Leave chest tubes for drainage.  Diuresis as BP will tolerate. Initiate anticoagulation with Coumadin and monitor INR.  -Mild expected acute blood loss anemia-she is tolerating this well.  No indication for transfusion.  Monitor.  -Endo-no history of diabetes-glucose well controlled.  Continue monitoring and provide SSI as needed.  -DVT prophylaxis-mobilize and begin subcu enoxaparin later today    Leary Roca, PA-C [phone] 09/01/2020 7:59 AM   I have seen and examined the patient and agree with the assessment and plan as outlined.  Purcell Nails, MD 09/01/2020

## 2020-09-01 NOTE — Discharge Instructions (Addendum)
Discharge Instructions: ° °1. You may shower, please wash incisions daily with soap and water and keep dry.  If you wish to cover wounds with dressing you may do so but please keep clean and change daily.  No tub baths or swimming until incisions have completely healed.  If your incisions become red or develop any drainage please call our office at [phone] ° °2. No Driving until cleared by Dr. Owen's office and you are no longer using narcotic pain medications ° °3. Monitor your weight daily.. Please use the same scale and weigh at same time... If you gain 5-10 lbs in 48 hours with associated lower extremity swelling, please contact our office at [phone] ° °4. Fever of 101.5 for at least 24 hours with no source, please contact our office at [phone] ° °5. Activity- up as tolerated, please walk at least 3 times per day.  Avoid strenuous activity, no lifting, pushing, or pulling with your arms over 8-10 lbs for a minimum of 6 weeks ° °6. If any questions or concerns arise, please do not hesitate to contact our office at [phone] ° °Information on my medicine - Coumadin®   (Warfarin) ° °This medication education was reviewed with me or my healthcare representative as part of my discharge preparation.  The pharmacist that spoke with me during my hospital stay was:  Carney, Jessica C, RPH ° °Why was Coumadin prescribed for you? °Coumadin was prescribed for you because you have a blood clot or a medical condition that can cause an increased risk of forming blood clots. Blood clots can cause serious health problems by blocking the flow of blood to the heart, lung, or brain. Coumadin can prevent harmful blood clots from forming. °As a reminder your indication for Coumadin is:   Blood Clot Prevention After Heart Valve Surgery ° °What test will check on my response to Coumadin? °While on Coumadin (warfarin) you will need to have an INR test regularly to ensure that your dose is keeping you in the desired  range. The INR (international normalized ratio) number is calculated from the result of the laboratory test called prothrombin time (PT). ° °If an INR APPOINTMENT HAS NOT ALREADY BEEN MADE FOR YOU please schedule an appointment to have this lab work done by your health care provider within 7 days. °Your INR goal is usually a number between:  2 to 3 or your provider may give you a more narrow range like 2-2.5.  Ask your health care provider during an office visit what your goal INR is. ° °What  do you need to  know  About  COUMADIN? °Take Coumadin (warfarin) exactly as prescribed by your healthcare provider about the same time each day.  DO NOT stop taking without talking to the doctor who prescribed the medication.  Stopping without other blood clot prevention medication to take the place of Coumadin may increase your risk of developing a new clot or stroke.  Get refills before you run out. ° °What do you do if you miss a dose? °If you miss a dose, take it as soon as you remember on the same day then continue your regularly scheduled regimen the next day.  Do not take two doses of Coumadin at the same time. ° °Important Safety Information °A possible side effect of Coumadin (Warfarin) is an increased risk of bleeding. You should call your healthcare provider right away if you experience any of the following: °? Bleeding from an injury or your nose that does   not stop. °? Unusual colored urine (red or dark brown) or unusual colored stools (red or black). °? Unusual bruising for unknown reasons. °? A serious fall or if you hit your head (even if there is no bleeding). ° °Some foods or medicines interact with Coumadin® (warfarin) and might alter your response to warfarin. To help avoid this: °? Eat a balanced diet, maintaining a consistent amount of Vitamin K. °? Notify your provider about major diet changes you plan to make. °? Avoid alcohol or limit your intake to 1 drink for women and 2 drinks for men per day. °(1  drink is 5 oz. wine, 12 oz. beer, or 1.5 oz. liquor.) ° °Make sure that ANY health care provider who prescribes medication for you knows that you are taking Coumadin (warfarin).  Also make sure the healthcare provider who is monitoring your Coumadin knows when you have started a new medication including herbals and non-prescription products. ° °Coumadin® (Warfarin)  Major Drug Interactions  °Increased Warfarin Effect Decreased Warfarin Effect  °Alcohol (large quantities) °Antibiotics (esp. Septra/Bactrim, Flagyl, Cipro) °Amiodarone (Cordarone) °Aspirin (ASA) °Cimetidine (Tagamet) °Megestrol (Megace) °NSAIDs (ibuprofen, naproxen, etc.) °Piroxicam (Feldene) °Propafenone (Rythmol SR) °Propranolol (Inderal) °Isoniazid (INH) °Posaconazole (Noxafil) Barbiturates (Phenobarbital) °Carbamazepine (Tegretol) °Chlordiazepoxide (Librium) °Cholestyramine (Questran) °Griseofulvin °Oral Contraceptives °Rifampin °Sucralfate (Carafate) °Vitamin K  ° °Coumadin® (Warfarin) Major Herbal Interactions  °Increased Warfarin Effect Decreased Warfarin Effect  °Garlic °Ginseng °Ginkgo biloba Coenzyme Q10 °Green tea °St. John’s wort   ° °Coumadin® (Warfarin) FOOD Interactions  °Eat a consistent number of servings per week of foods HIGH in Vitamin K °(1 serving = ½ cup)  °Collards (cooked, or boiled & drained) °Kale (cooked, or boiled & drained) °Mustard greens (cooked, or boiled & drained) °Parsley *serving size only = ¼ cup °Spinach (cooked, or boiled & drained) °Swiss chard (cooked, or boiled & drained) °Turnip greens (cooked, or boiled & drained)  °Eat a consistent number of servings per week of foods MEDIUM-HIGH in Vitamin K °(1 serving = 1 cup)  °Asparagus (cooked, or boiled & drained) °Broccoli (cooked, boiled & drained, or raw & chopped) °Brussel sprouts (cooked, or boiled & drained) *serving size only = ½ cup °Lettuce, raw (green leaf, endive, romaine) °Spinach, raw °Turnip greens, raw & chopped  ° °These websites have more information  on Coumadin (warfarin):  www.coumadin.com; °www.ahrq.gov/consumer/coumadin.htm; ° ° ° °

## 2020-09-01 NOTE — Progress Notes (Signed)
TCTS BRIEF SICU PROGRESS NOTE  1 Day Post-Op  S/P Procedure(s) (LRB): MINIMALLY INVASIVE MITRAL VALVE REPAIR (MVR) USING MEMO 4D RING SIZE (Right) TRANSESOPHAGEAL ECHOCARDIOGRAM (TEE) (N/A)   Doing well Ambulated twice w/out difficulty NSR w/ stable BP Breathing comfortably on room air  Plan: Continue current plan  Purcell Nails, MD 09/01/2020 5:23 PM

## 2020-09-02 ENCOUNTER — Inpatient Hospital Stay (HOSPITAL_COMMUNITY): Payer: Federal, State, Local not specified - PPO

## 2020-09-02 LAB — CBC
HCT: 28.1 % — ABNORMAL LOW (ref 36.0–46.0)
Hemoglobin: 9.3 g/dL — ABNORMAL LOW (ref 12.0–15.0)
MCH: 31.4 pg (ref 26.0–34.0)
MCHC: 33.1 g/dL (ref 30.0–36.0)
MCV: 94.9 fL (ref 80.0–100.0)
Platelets: 145 10*3/uL — ABNORMAL LOW (ref 150–400)
RBC: 2.96 MIL/uL — ABNORMAL LOW (ref 3.87–5.11)
RDW: 12.4 % (ref 11.5–15.5)
WBC: 11 10*3/uL — ABNORMAL HIGH (ref 4.0–10.5)
nRBC: 0 % (ref 0.0–0.2)

## 2020-09-02 LAB — BASIC METABOLIC PANEL
Anion gap: 6 (ref 5–15)
BUN: <5 mg/dL — ABNORMAL LOW (ref 6–20)
CO2: 24 mmol/L (ref 22–32)
Calcium: 7.8 mg/dL — ABNORMAL LOW (ref 8.9–10.3)
Chloride: 105 mmol/L (ref 98–111)
Creatinine, Ser: 0.63 mg/dL (ref 0.44–1.00)
GFR, Estimated: >60 mL/min (ref >60)
Glucose, Bld: 131 mg/dL — ABNORMAL HIGH (ref 70–99)
Potassium: 4.2 mmol/L (ref 3.5–5.1)
Sodium: 135 mmol/L (ref 135–145)

## 2020-09-02 LAB — PROTIME-INR
INR: 1.1 (ref 0.8–1.2)
Prothrombin Time: 14.1 seconds (ref 11.4–15.2)

## 2020-09-02 LAB — MAGNESIUM: Magnesium: 2.3 mg/dL (ref 1.7–2.4)

## 2020-09-02 LAB — GLUCOSE, CAPILLARY: Glucose-Capillary: 121 mg/dL — ABNORMAL HIGH (ref 70–99)

## 2020-09-02 MED ORDER — FE FUMARATE-B12-VIT C-FA-IFC PO CAPS
1.0000 | ORAL_CAPSULE | Freq: Every day | ORAL | Status: DC
  Administered 2020-09-03 – 2020-09-04 (×2): 1 via ORAL
  Filled 2020-09-02 (×3): qty 1

## 2020-09-02 MED ORDER — KETOROLAC TROMETHAMINE 15 MG/ML IJ SOLN
15.0000 mg | Freq: Four times a day (QID) | INTRAMUSCULAR | Status: AC
  Administered 2020-09-02 (×3): 15 mg via INTRAVENOUS
  Filled 2020-09-02 (×3): qty 1

## 2020-09-02 MED ORDER — ~~LOC~~ CARDIAC SURGERY, PATIENT & FAMILY EDUCATION: Freq: Once | Status: AC

## 2020-09-02 MED ORDER — METOPROLOL TARTRATE 12.5 MG HALF TABLET
12.5000 mg | ORAL_TABLET | Freq: Two times a day (BID) | ORAL | Status: DC
  Administered 2020-09-02 – 2020-09-04 (×5): 12.5 mg via ORAL
  Filled 2020-09-02 (×5): qty 1

## 2020-09-02 MED ORDER — PROMETHAZINE HCL 25 MG/ML IJ SOLN
12.5000 mg | Freq: Four times a day (QID) | INTRAMUSCULAR | Status: DC | PRN
  Administered 2020-09-02: 12.5 mg via INTRAVENOUS
  Filled 2020-09-02: qty 1

## 2020-09-02 MED ORDER — POTASSIUM CHLORIDE CRYS ER 20 MEQ PO TBCR
20.0000 meq | EXTENDED_RELEASE_TABLET | Freq: Two times a day (BID) | ORAL | Status: DC
  Administered 2020-09-02 (×2): 20 meq via ORAL
  Filled 2020-09-02 (×2): qty 1

## 2020-09-02 NOTE — Progress Notes (Signed)
[address]       [name] [address]             [phone]        CARDIOTHORACIC SURGERY PROGRESS NOTE   R2 Days Post-Op Procedure(s) (LRB): MINIMALLY INVASIVE MITRAL VALVE REPAIR (MVR) USING MEMO 4D RING SIZE (Right) TRANSESOPHAGEAL ECHOCARDIOGRAM (TEE) (N/A)  Subjective: Had some nausea and pain overnight.  Feeling better this morning.  Objective: Vital signs: BP Readings from Last 1 Encounters:  09/02/20 121/71   Pulse Readings from Last 1 Encounters:  09/02/20 76   Resp Readings from Last 1 Encounters:  09/02/20 (!) 28   Temp Readings from Last 1 Encounters:  09/02/20 98.9 F (37.2 C) (Oral)    Hemodynamics:    Physical Exam:  Rhythm:   sinus  Breath sounds: clear  Heart sounds:  RRR w/out murmur  Incisions:  Dressings dry, intact  Abdomen:  Soft, non-distended, non-tender  Extremities:  Warm, well-perfused  Chest tubes:  decreasing volume thin serosanguinous output, no air leak    Intake/Output from previous day: 02/11 0701 - 02/12 0700 In: 1914.1 [P.O.:1200; I.V.:12.5; IV Piggyback:701.6] Out: 2375 [Urine:1970; Chest Tube:405] Intake/Output this shift: No intake/output data recorded.  Lab Results:  CBC: Recent Labs    09/01/20 1652 09/02/20 0317  WBC 12.2* 11.0*  HGB 9.8* 9.3*  HCT 30.0* 28.1*  PLT 166 145*    BMET:  Recent Labs    09/01/20 1652 09/02/20 0317  NA 135 135  K 3.3* 4.2  CL 103 105  CO2 22 24  GLUCOSE 157* 131*  BUN 6 <5*  CREATININE 0.77 0.63  CALCIUM 7.9* 7.8*     PT/INR:   Recent Labs    09/02/20 0317  LABPROT 14.1  INR 1.1    CBG (last 3)  Recent Labs    09/01/20 1548 09/01/20 1942 09/02/20 0026  GLUCAP 127* 117* 121*    ABG    Component Value Date/Time   PHART 7.303 (L) 08/31/2020 1404   PCO2ART 41.5 08/31/2020 1404   PO2ART 120 (H) 08/31/2020 1404   HCO3 20.8 08/31/2020 1404   TCO2 22 08/31/2020 1404   ACIDBASEDEF 6.0 (H) 08/31/2020 1404   O2SAT 98.0  08/31/2020 1404    CXR: PORTABLE CHEST 1 VIEW  COMPARISON:  September 01, 2020  FINDINGS: Chest tube position on the right is stable. Central catheter tip is in the left innominate vein, stable. Temporary pacemaker wires are attached to the right heart. No evident pneumothorax. Atelectatic change noted in the right mid lung and bibasilar regions. There is subtle airspace opacity in the medial left base. Heart is prominent, stable, with pulmonary vascularity normal. Status post mitral valve replacement. No adenopathy. No bone lesions.  IMPRESSION: Tube and catheter positions as described without evident pneumothorax. Areas of atelectasis bilaterally. New area of opacity medial left base, likely atelectasis with questionable early developing pneumonia in this area. Stable cardiac prominence. Status post mitral valve replacement.   Electronically Signed   By: Bretta Bang III M.D.   On: 09/02/2020 08:45  Assessment/Plan: S/P Procedure(s) (LRB): MINIMALLY INVASIVE MITRAL VALVE REPAIR (MVR) USING MEMO 4D RING SIZE (Right) TRANSESOPHAGEAL ECHOCARDIOGRAM (TEE) (N/A)  Overall doing well POD2 Maintaining NSR w/ stable BP Breathing comfortably on room air Expected post op acute blood loss anemia, mild, stable Expected post op atelectasis, mild Expected post op volume excess, weight reportedly 1.2 kg > preop, UOP adequate   Mobilize  Diuresis  Add low dose beta blocker to prevent Afib  Continue Coumadin  Leave chest tubes in 1 more day  Transfer 4E   Purcell Nails, MD 09/02/2020 9:01 AM

## 2020-09-02 NOTE — Plan of Care (Signed)

## 2020-09-03 LAB — BASIC METABOLIC PANEL
Anion gap: 8 (ref 5–15)
BUN: 5 mg/dL — ABNORMAL LOW (ref 6–20)
CO2: 26 mmol/L (ref 22–32)
Calcium: 8.2 mg/dL — ABNORMAL LOW (ref 8.9–10.3)
Chloride: 102 mmol/L (ref 98–111)
Creatinine, Ser: 0.7 mg/dL (ref 0.44–1.00)
GFR, Estimated: >60 mL/min (ref >60)
Glucose, Bld: 108 mg/dL — ABNORMAL HIGH (ref 70–99)
Potassium: 4 mmol/L (ref 3.5–5.1)
Sodium: 136 mmol/L (ref 135–145)

## 2020-09-03 LAB — CBC
HCT: 31.4 % — ABNORMAL LOW (ref 36.0–46.0)
Hemoglobin: 10.4 g/dL — ABNORMAL LOW (ref 12.0–15.0)
MCH: 31.3 pg (ref 26.0–34.0)
MCHC: 33.1 g/dL (ref 30.0–36.0)
MCV: 94.6 fL (ref 80.0–100.0)
Platelets: 179 10*3/uL (ref 150–400)
RBC: 3.32 MIL/uL — ABNORMAL LOW (ref 3.87–5.11)
RDW: 12.3 % (ref 11.5–15.5)
WBC: 9.4 10*3/uL (ref 4.0–10.5)
nRBC: 0 % (ref 0.0–0.2)

## 2020-09-03 LAB — PROTIME-INR
INR: 1.2 (ref 0.8–1.2)
Prothrombin Time: 14.3 seconds (ref 11.4–15.2)

## 2020-09-03 MED ORDER — FUROSEMIDE 40 MG PO TABS
40.0000 mg | ORAL_TABLET | Freq: Every day | ORAL | Status: AC
  Administered 2020-09-03 – 2020-09-04 (×2): 40 mg via ORAL
  Filled 2020-09-03 (×2): qty 1

## 2020-09-03 MED ORDER — WARFARIN SODIUM 5 MG PO TABS
5.0000 mg | ORAL_TABLET | Freq: Every day | ORAL | Status: DC
  Administered 2020-09-03: 5 mg via ORAL
  Filled 2020-09-03: qty 1

## 2020-09-03 MED ORDER — POTASSIUM CHLORIDE CRYS ER 20 MEQ PO TBCR
20.0000 meq | EXTENDED_RELEASE_TABLET | Freq: Every day | ORAL | Status: AC
  Administered 2020-09-03 – 2020-09-04 (×2): 20 meq via ORAL
  Filled 2020-09-03 (×2): qty 1

## 2020-09-03 NOTE — Progress Notes (Signed)
Both right chest tubes removed as ordered. Dressing applied. bp 115/54 97% on room air. 75 heart rate. Will monitor patient.Landi Biscardi, Randall An RN

## 2020-09-03 NOTE — Progress Notes (Addendum)
      [address]       [place] [address]             [phone]        3 Days Post-Op Procedure(s) (LRB): MINIMALLY INVASIVE MITRAL VALVE REPAIR (MVR) USING MEMO 4D RING SIZE (Right) TRANSESOPHAGEAL ECHOCARDIOGRAM (TEE) (N/A)  Subjective: Patient passing flatus but no bowel movement yet.  Objective: Vital signs in last 24 hours: Temp:  [97.8 F (36.6 C)-98.8 F (37.1 C)] 98.1 F (36.7 C) (02/13 0735) Pulse Rate:  [65-152] 78 (02/13 0735) Cardiac Rhythm: Normal sinus rhythm (02/12 1900) Resp:  [13-26] 20 (02/13 0735) BP: (89-132)/(53-72) 117/71 (02/13 0735) SpO2:  [93 %-99 %] 99 % (02/13 0735) Weight:  [76.6 kg] 76.6 kg (02/13 0544)  Pre op weight 76.3  kg Current Weight  09/03/20 76.6 kg      Intake/Output from previous day: 02/12 0701 - 02/13 0700 In: 1220 [P.O.:1220] Out: 2200 [Urine:2000; Chest Tube:200]   Physical Exam:  Cardiovascular: RRR Pulmonary: Slightly diminished right basilar breath sounds Abdomen: Soft, non tender, bowel sounds present. Extremities: Mild bilateral lower extremity edema. Wounds: Aquacel removed and wound is clean and dry.  No erythema or signs of infection.  Lab Results: CBC: Recent Labs    09/02/20 0317 09/03/20 0159  WBC 11.0* 9.4  HGB 9.3* 10.4*  HCT 28.1* 31.4*  PLT 145* 179   BMET:  Recent Labs    09/02/20 0317 09/03/20 0159  NA 135 136  K 4.2 4.0  CL 105 102  CO2 24 26  GLUCOSE 131* 108*  BUN <5* 5*  CREATININE 0.63 0.70  CALCIUM 7.8* 8.2*    PT/INR:  Lab Results  Component Value Date   INR 1.2 09/03/2020   INR 1.1 09/02/2020   INR 1.3 (H) 08/31/2020   ABG:  INR: Will add last result for INR, ABG once components are confirmed Will add last 4 CBG results once components are confirmed  Assessment/Plan:  1. CV - SR with HR in the 70's. On Lopressor 12.5 mg bid and Coumadin. INR this am 1.2. Increase Coumadin to 5 mg. 2.  Pulmonary - On room air. Chest tubes with 200 cc  last 24 hours. Remove EPW and then chest tubes. Check PA/LAT CXR In am. Encourage incentive spirometer. 3. Volume Overload - Will give Lasix 40 mg daily 4.  Expected post op acute blood loss anemia - H and H this am stable at 10.4 and 31.4. Continue Trinsicon 5. Home 1-2 days  Lelon Huh [facility] 09/03/2020,8:46 AM   I have seen and examined the patient and agree with the assessment and plan as outlined.  Anticipate likely d/c tomorrow.  Patient will stay locally in [facility] until Thursday and get INR checked at that time, then plan to return to [facility] as long as she's still doing well.  Will plan f/u appointment in our office w/ CXR on 09/18/2020.  Dr Delphina Cahill (ocal cardiologist in [facility]) will follow INR after she returns there with plans to continue Coumadin x3 months.  Purcell Nails, MD 09/03/2020 10:55 AM

## 2020-09-03 NOTE — Progress Notes (Signed)
Mobility Specialist: Progress Note   09/03/20 1713  Mobility  Activity Ambulated in hall  Level of Assistance Independent  Assistive Device Front wheel walker  Distance Ambulated (ft) 960 ft  Mobility Response Tolerated well  Mobility performed by Mobility specialist  Bed Position Chair  $Mobility charge 1 Mobility   Pre-Mobility: 77 HR Post-Mobility: 93 HR  Pt asx during ambulation.   [facility] Enrica Corliss Mobility Specialist Mobility Specialist Phone: [phone]

## 2020-09-03 NOTE — Progress Notes (Signed)
Pt ambulated 800 feet with front wheel walker. tolerated well. HR stayed in 80's NSR. Will continue to monitor.

## 2020-09-03 NOTE — Progress Notes (Signed)
EPW d/c's at 0956 without difficulty.  Wires intact.  Site w/o drainage.  Patient tolerated well.  Bed rest initiated x 1 hour.  Vital signs initiated Q15.

## 2020-09-04 ENCOUNTER — Inpatient Hospital Stay (HOSPITAL_COMMUNITY): Payer: Federal, State, Local not specified - PPO

## 2020-09-04 LAB — PROTIME-INR
INR: 1.2 (ref 0.8–1.2)
Prothrombin Time: 15 seconds (ref 11.4–15.2)

## 2020-09-04 MED ORDER — TRAMADOL HCL 50 MG PO TABS
50.0000 mg | ORAL_TABLET | ORAL | 0 refills | Status: AC | PRN
Start: 2020-09-04 — End: 2020-09-09

## 2020-09-04 MED ORDER — METOPROLOL TARTRATE 25 MG PO TABS: 12.5000 mg | ORAL_TABLET | Freq: Two times a day (BID) | ORAL | 2 refills | Status: AC

## 2020-09-04 MED ORDER — WARFARIN SODIUM 5 MG PO TABS: 5.0000 mg | ORAL_TABLET | Freq: Every day | ORAL | 2 refills | Status: AC

## 2020-09-04 MED ORDER — ASPIRIN 81 MG PO TBEC: 81.0000 mg | DELAYED_RELEASE_TABLET | Freq: Every day | ORAL | 11 refills | Status: AC

## 2020-09-04 MED FILL — Electrolyte-R (PH 7.4) Solution: INTRAVENOUS | Qty: 4000 | Status: AC

## 2020-09-04 MED FILL — Sodium Chloride IV Soln 0.9%: INTRAVENOUS | Qty: 3000 | Status: AC

## 2020-09-04 MED FILL — Potassium Chloride Inj 2 mEq/ML: INTRAVENOUS | Qty: 40 | Status: AC

## 2020-09-04 MED FILL — Lidocaine HCl Local Preservative Free (PF) Inj 2%: INTRAMUSCULAR | Qty: 15 | Status: AC

## 2020-09-04 MED FILL — Sodium Bicarbonate IV Soln 8.4%: INTRAVENOUS | Qty: 50 | Status: AC

## 2020-09-04 MED FILL — Heparin Sodium (Porcine) Inj 1000 Unit/ML: INTRAMUSCULAR | Qty: 20 | Status: AC

## 2020-09-04 MED FILL — Heparin Sodium (Porcine) Inj 1000 Unit/ML: INTRAMUSCULAR | Qty: 30 | Status: AC

## 2020-09-04 NOTE — Progress Notes (Signed)
CARDIAC REHAB PHASE I   PRE:  Rate/Rhythm: 75 SR  BP:  Supine: 124/69  Sitting:   Standing:    SaO2: 995RA  MODE:  Ambulation: 540 ft   POST:  Rate/Rhythm: 99 SR  BP:  Supine:   Sitting: 149/77  Standing:    SaO2: 99%RA 0816-0850 Pt walked 540 ft on RA without RW and tolerated well.  To bathroom and then sitting on side of bed after walk. Education completed with pt who voiced understanding. Reviewed walking instructions for ex, gave heart healthy diet for information, wound care, and encouraged IS. Discussed CRP 2. Pt not interested at this time. Knows to discuss with cardiologist in [facility] if she changes her mind.    Luetta Nutting, RN BSN  09/04/2020 8:46 AM

## 2020-09-04 NOTE — Discharge Summary (Signed)
Physician Discharge Summary  Patient ID: Alexa Pennington MRN: [id number] DOB/AGE: 26-Nov-1965 55 y.o.  Admit date: 08/31/2020 Discharge date: 09/04/2020  Admission Diagnoses:  Severe mitral insufficiency  Discharge Diagnoses:   Mitral regurgitation due to cusp prolapse S/P minimally-invasive mitral valve repair Expected acute blood loss anemia, mild     Discharged Condition: stable  History of Present Illness:   Patient is 55 year old female with longstanding history of mitral valve prolapse and mitral regurgitation who has been referred for surgical consultation.   Patient states that she was first told that she had a heart murmur by her pediatrician when she was in junior high school.  Several female family members have had the diagnosis of mitral valve prolapse as well.  She initially had several serial echocardiograms studies as well as Holter monitor for evaluation of palpitations.  She remained clinically stable and went for nearly 20 years without further formal cardiac follow-up.  More recently she has been followed with routine echocardiography.  Transthoracic echocardiograms have reportedly demonstrated the presence of normal left ventricular function with moderately severe and severe mitral regurgitation.  She was seen in follow-up by her cardiologist in [place] and underwent transesophageal echocardiogram which demonstrated myxomatous degenerative disease of the mitral valve with a large flail segment of the posterior leaflet and obvious ruptured chordae tendinae.  There was severe mitral regurgitation and normal left ventricular systolic function.  The patient discussed these findings with her cousin, Dr. Earney Hamburg, and her transesophageal echocardiogram was forwarded to our multidisciplinary heart valve team for review.  The patient was seen in consultation by Dr. [name] earlier today and subsequently underwent coronary CT angiography and was referred for surgical  consultation.   Patient is divorced and lives alone in [place], [place].  She has 2 adult children 1 of whom just graduated from [employer] [place] and the other remains in college and runs track at [employer].  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  This primarily involves working from home.  The patient has remained physically active and entirely functionally independent throughout her adult life.  A year and a half ago she sustained an injury to her knee and that limited how much exercise she had been getting.  However, more recently she has been back walking on a regular basis.  She denies any significant physical limitations although she states that she "feels out of shape".  She does note that if she walks up a flight of stairs she seems to be a little bit short of breath by the time she gets to the top step, but this is mild and does not seem to limit her at all.  She otherwise has no symptoms of exertional shortness of breath or chest discomfort.  She has never had resting shortness of breath, PND, orthopnea, or lower extremity edema.  She states that she has had some palpitations in the past but she is no longer aware of them.  She has never had any dizziness or syncope.  Hospital Course:   On 08/31/2020 Alexa Pennington underwent a Minimally-Invasive Mitral Valve Repair with Dr. Cornelius Moras. The patient tolerated the procedure well. She was extubaten in the OR at the conclusion of the procedure and was transferred to the PACU in stable condition.  She remained hemodynamically stable.  By the morning of first postoperative day she was mobilized to the bedside chair.  She was in a stable sinus rhythm and having minimal chest tube  drainage.  Anticoagulation with Coumadin was begun.  The INR was monitored daily.  She was felt surgically stable for transfer from the ICU to 4E on 02/12. She is ambulating on room air with good  oxygenation. Epicardial pacing wires and chest tubes were removed on 02/13. She was volume overloaded and diuresed. She had expected post op blood loss anemia. Her last H and H was stable at 10.4 and 31.4. Her wounds are clean, dry, and healing without signs of infection. On 02/14, her INR is 1.2 and she is on 5 mg of Coumadin daily. PT/INR will be checked on 09/07/20 at the Coumadin Clinic here in [place]. After this, she plans to return to her home in [place] and her INR will be managed by her cardiologist there (Dr. Thera Flake). Patient is felt surgically stable for discharge today.  .     Consults: None  Significant Diagnostic Studies:   CLINICAL DATA:  Atelectasis.  EXAM: CHEST - 2 VIEW  COMPARISON:  September 02, 2020.  FINDINGS: Stable cardiomediastinal silhouette. No pneumothorax is noted status post cardiac valve repair. Stable bilateral lung opacities are noted concerning for subsegmental atelectasis. Bony thorax is unremarkable.  IMPRESSION: No active cardiopulmonary disease.   Electronically Signed   By: Lupita Raider M.D.   On: 09/04/2020 08:06   Treatments:   CARDIOTHORACIC SURGERY OPERATIVE NOTE  Date of Procedure:                08/31/2020  Preoperative Diagnosis:      Severe Mitral Regurgitation  Postoperative Diagnosis:    Same  Procedure:        Minimally-Invasive Mitral Valve Repair             Complex valvuloplasty including triangular resection of posterior leaflet             Artificial Gore-tex neochord placement x6             Sorin Memo 4D Ring Annuloplasty (size 54mm, catalog # [id number], serial # [id number])               Surgeon:        Salvatore Decent. Cornelius Moras, MD  Assistant:       Jari Favre, PA-C  Anesthesia:    Kipp Brood, MD  Operative Findings: ? 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 Multiple ruptured primary chordae tendinae with large flail portion of middle scallop (P2) of  posterior leaflet ? Type II dysfunction with severe mitral regurgitation ? Normal left ventricular systolic function ? Trivial residual mitral regurgitation after successful valve repair    .                BRIEF CLINICAL NOTE AND INDICATIONS FOR SURGERY  Patient is 55 year old female with longstanding history of mitral valve prolapse and mitral regurgitation who has been referred for surgical consultation.  Patient states that she was first told that she had a heart murmur by her pediatrician when she was in junior high school. Several female family members have had the diagnosis of mitral valve prolapse as well. She initially had several serial echocardiograms studies as well as Holter monitor for evaluation of palpitations. She remained clinically stable and went for nearly 20 years without further formal cardiac follow-up. More recently she has been followed with routine echocardiography. Transthoracic echocardiograms have reportedly demonstrated the presence of normal left ventricular function with moderately severe and severe mitral regurgitation. She was seen in follow-up by her cardiologist  in [place] and underwent transesophageal echocardiogram which demonstrated myxomatous degenerative disease of the mitral valve with a large flail segment of the posterior leaflet and obvious ruptured chordae tendinae.There was severe mitral regurgitation and normal left ventricular systolic function. The patient discussed these findings with her cousin, Dr. Earney Hamburg, and her transesophageal echocardiogram was forwarded to our multidisciplinary heart valve team for review. The patient was seen in consultation by Dr. [name] earlier today and subsequently underwent coronary CT angiography and was referred for surgical consultation.  The patient has been seen in consultation and counseled at length regarding the indications, risks and potential benefits of  surgery.  All questions have been answered, and the patient provides full informed consent for the operation as described.  Discharge Exam: Blood pressure 124/69, pulse 84, temperature 98.5 F (36.9 C), temperature source Oral, resp. rate 16, height 5\' 7"  (1.702 m), weight 75.5 kg, SpO2 98 %.  General appearance: alert, cooperative and no distress Neurologic: intact Heart: regular rate and rhythm, NSR on monitor.  Lungs: Breath sounds are clear. CT removed yesterday. CXR this AM OK. Abdomen: Soft and nontender Extremities: All are well perfused.  No peripheral edema.   Wound: The right chest wound is open to air air is well approximated and dry. Chest tube sites are covered with a dry dressing.   Disposition:    Allergies as of 09/04/2020      Reactions   Levaquin [levofloxacin] Itching, Swelling      Medication List    STOP taking these medications   ibuprofen 200 MG tablet Commonly known as: ADVIL     TAKE these medications   aspirin 81 MG EC tablet Take 1 tablet (81 mg total) by mouth daily. Swallow whole. Start taking on: September 05, 2020   levonorgestrel-ethinyl estradiol 0.15-30 MG-MCG tablet Commonly known as: NORDETTE Take 1 tablet by mouth daily.   metoprolol tartrate 25 MG tablet Commonly known as: LOPRESSOR Take 0.5 tablets (12.5 mg total) by mouth 2 (two) times daily.   traMADol 50 MG tablet Commonly known as: ULTRAM Take 1 tablet (50 mg total) by mouth every 4 (four) hours as needed for up to 5 days for moderate pain.   warfarin 5 MG tablet Commonly known as: COUMADIN Take 1 tablet (5 mg total) by mouth daily at 4 PM. Or as directed by the Coumadin Clinic.       Follow-up Information    11-28-2003, MD. Go on 09/18/2020.   Specialty: Cardiothoracic Surgery Why: Your appointment is at 9am.  Please arrive 30 minutes early for a chest x-ray to be performed by [facility] located on the first floor of the same buliding.  Contact  information: [address] [place] [address] [place] [phone]        [facility]. Go on 09/07/2020.   Specialty: Cardiology Why: Your appointment for the INR (blood test for coumadin) is on Thursday at 2:30pm. Contact information: [address], [address] [place] [phone]       [facility]. Go on 10/13/2020.   Specialty: Cardiology Why: Follow up ECHO will be on Friday 10/13/20 at 11:30am.  Contact information: [address] [place] [address] [place] [phone]              Signed: [phone], PA-C 09/04/2020, 9:23 AM

## 2020-09-04 NOTE — Progress Notes (Addendum)
[facility] DAILY ICU PROGRESS NOTE                   [address]            [place] [address]          [phone]   4 Days Post-Op Procedure(s) (LRB): MINIMALLY INVASIVE MITRAL VALVE REPAIR (MVR) USING MEMO 4D RING SIZE (Right) TRANSESOPHAGEAL ECHOCARDIOGRAM (TEE) (N/A)  Total Length of Stay:  LOS: 4 days   Subjective: Up in the bedside chair.  Awake and alert.  Says she feels good and is ready to leave the hospital.  On RA, independent with mobility, having appropriate bowel function.   Objective: Vital signs in last 24 hours: Temp:  [98.5 F (36.9 C)-99.1 F (37.3 C)] 98.5 F (36.9 C) (02/14 0500) Pulse Rate:  [71-85] 75 (02/14 0500) Cardiac Rhythm: Normal sinus rhythm (02/13 1905) Resp:  [16-22] 16 (02/14 0500) BP: (94-117)/(54-72) 117/65 (02/14 0500) SpO2:  [96 %-100 %] 97 % (02/14 0500) Weight:  [75.5 kg] 75.5 kg (02/14 0500)  Filed Weights   09/02/20 0600 09/03/20 0544 09/04/20 0500  Weight: 79.5 kg 76.6 kg 75.5 kg    Weight change: -1.1 kg      Intake/Output from previous day: 02/13 0701 - 02/14 0700 In: 240 [P.O.:240] Out: -   Intake/Output this shift: No intake/output data recorded.  Current Meds: Scheduled Meds: . acetaminophen  1,000 mg Oral Q6H  . aspirin EC  81 mg Oral Daily  . bisacodyl  10 mg Oral Daily   Or  . bisacodyl  10 mg Rectal Daily  . chlorhexidine  15 mL Mouth Rinse BID  . Chlorhexidine Gluconate Cloth  6 each Topical Daily  . docusate sodium  200 mg Oral Daily  . enoxaparin (LOVENOX) injection  40 mg Subcutaneous QHS  . ferrous fumarate-b12-vitamic C-folic acid  1 capsule Oral Q breakfast  . furosemide  40 mg Oral Daily  . levonorgestrel-ethinyl estradiol  1 tablet Oral Daily  . mouth rinse  15 mL Mouth Rinse q12n4p  . metoprolol tartrate  12.5 mg Oral BID  . pantoprazole  40 mg Oral Daily  . potassium chloride  20 mEq Oral Daily  . sodium chloride flush  3 mL Intravenous Q12H  . warfarin  5 mg Oral q1600   . Warfarin - Physician Dosing Inpatient   Does not apply q1600   Continuous Infusions: . sodium chloride    . lactated ringers    . lactated ringers     PRN Meds:.metoprolol tartrate, morphine injection, ondansetron (ZOFRAN) IV, oxyCODONE, promethazine, sodium chloride flush, traMADol  General appearance: alert, cooperative and no distress Neurologic: intact Heart: regular rate and rhythm, NSR on monitor.  Lungs: Breath sounds are clear. CT removed yesterday. CXR this AM OK. Abdomen: Soft and nontender Extremities: All are well perfused.  No peripheral edema.   Wound: The right chest wound is open to air air is well approximated and dry. Chest tube sites are covered with a dry dressing.   Lab Results: CBC: Recent Labs    09/02/20 0317 09/03/20 0159  WBC 11.0* 9.4  HGB 9.3* 10.4*  HCT 28.1* 31.4*  PLT 145* 179   BMET:  Recent Labs    09/02/20 0317 09/03/20 0159  NA 135 136  K 4.2 4.0  CL 105 102  CO2 24 26  GLUCOSE 131* 108*  BUN <5* 5*  CREATININE 0.63 0.70  CALCIUM 7.8* 8.2*    CMET: Lab  Results  Component Value Date   WBC 9.4 09/03/2020   HGB 10.4 (L) 09/03/2020   HCT 31.4 (L) 09/03/2020   PLT 179 09/03/2020   GLUCOSE 108 (H) 09/03/2020   ALT 14 08/30/2020   AST 18 08/30/2020   NA 136 09/03/2020   K 4.0 09/03/2020   CL 102 09/03/2020   CREATININE 0.70 09/03/2020   BUN 5 (L) 09/03/2020   CO2 26 09/03/2020   INR 1.2 09/04/2020   HGBA1C 5.1 08/30/2020      PT/INR:  Recent Labs    09/04/20 0119  LABPROT 15.0  INR 1.2   Radiology: No results found.   Assessment/Plan: S/P Procedure(s) (LRB): MINIMALLY INVASIVE MITRAL VALVE REPAIR (MVR) USING MEMO 4D RING SIZE (Right) TRANSESOPHAGEAL ECHOCARDIOGRAM (TEE) (N/A)  -Postop day 4 minimally invasive mitral valve repair for severe mitral insufficiency.  Stable VS and cardiac rhythm. Independent with mobility.  Anticoagulation with Coumadin started, INR 1.2 after dosing at 2.5mg -2.5mg -5mg .    -Mild expected acute blood loss anemia-Hct has been trending up.  -DVT prophylaxis- on Lovenox daily since surgery, progressing with ambulation.   -Disposition- planning for discharge today.  She plans to stay locally through Thursday then will return to her home in Saturday.  Check the INR on Thursday at the [facility]madin Clinic then her INR will be followed by Dr. Thursday in [place]. Follow up in our office 09/18/20 will be arranged.    09/20/20, PA-C [phone] 09/04/2020 8:01 AM    I have seen and examined the patient and agree with the assessment and plan as outlined.  D/C today.  Instructions given.  09/06/2020, MD 09/04/2020 8:51 AM

## 2020-09-06 ENCOUNTER — Other Ambulatory Visit: Payer: Self-pay

## 2020-09-06 ENCOUNTER — Ambulatory Visit (INDEPENDENT_AMBULATORY_CARE_PROVIDER_SITE_OTHER): Payer: Federal, State, Local not specified - PPO | Admitting: *Deleted

## 2020-09-06 DIAGNOSIS — Z9889 Other specified postprocedural states: Secondary | ICD-10-CM | POA: Diagnosis not present

## 2020-09-06 DIAGNOSIS — Z5181 Encounter for therapeutic drug level monitoring: Secondary | ICD-10-CM | POA: Diagnosis not present

## 2020-09-06 LAB — POCT INR: INR: 1.7 — AB (ref 2.0–3.0)

## 2020-09-06 NOTE — Patient Instructions (Addendum)
  Description    Take 1 tablet of warfarin today and then start taking 1/2 a tablet daily. Recheck INR on 2/21 in [place]. Call coumadin clinic for any changes in medications, upcoming procedures or if you are not able to scheduled an appointment to have INR checked by Dr.Vanhorn. Coumadin Clinic [phone].   A full discussion of the nature of anticoagulants has been carried out.  A benefit risk analysis has been presented to the patient, so that they understand the justification for choosing anticoagulation at this time. The need for frequent and regular monitoring, precise dosage adjustment and compliance is stressed.  Side effects of potential bleeding are discussed.  The patient should avoid any OTC items containing aspirin or ibuprofen, and should avoid great swings in general diet.  Avoid alcohol consumption.  Call if any signs of abnormal bleeding.

## 2020-09-07 ENCOUNTER — Telehealth: Payer: Self-pay | Admitting: *Deleted

## 2020-09-07 NOTE — Telephone Encounter (Signed)
Called and spoke to pt whostated she called her cardiologist in [facility] and has an appointment to have her INR checked on  Monday 2/21.

## 2020-09-14 ENCOUNTER — Other Ambulatory Visit: Payer: Self-pay | Admitting: Thoracic Surgery (Cardiothoracic Vascular Surgery)

## 2020-09-14 DIAGNOSIS — Z9889 Other specified postprocedural states: Secondary | ICD-10-CM

## 2020-09-18 ENCOUNTER — Encounter: Payer: Self-pay | Admitting: Thoracic Surgery (Cardiothoracic Vascular Surgery)

## 2020-09-18 ENCOUNTER — Ambulatory Visit
Admission: RE | Admit: 2020-09-18 | Discharge: 2020-09-18 | Disposition: A | Discharge status: Home / Self Care | Payer: Federal, State, Local not specified - PPO | Source: Ambulatory Visit | Attending: Thoracic Surgery (Cardiothoracic Vascular Surgery) | Admitting: Thoracic Surgery (Cardiothoracic Vascular Surgery)

## 2020-09-18 ENCOUNTER — Other Ambulatory Visit: Payer: Self-pay

## 2020-09-18 ENCOUNTER — Ambulatory Visit (INDEPENDENT_AMBULATORY_CARE_PROVIDER_SITE_OTHER): Payer: Self-pay | Admitting: Thoracic Surgery (Cardiothoracic Vascular Surgery)

## 2020-09-18 VITALS — BP 125/80 | HR 96 | Temp 98.5°F | Resp 20 | Ht 67.0 in | Wt 167.0 lb

## 2020-09-18 DIAGNOSIS — Z9889 Other specified postprocedural states: Secondary | ICD-10-CM

## 2020-09-18 NOTE — Progress Notes (Signed)
[address]       [name] [address]             [phone]     CARDIOTHORACIC SURGERY OFFICE NOTE  Referring Provider is Tonny Bollman, MD Primary Cardiologist is Delphina Cahill, MD PCP is Patient, No Pcp Per   HPI:  Patient is a 55 year old female who returns the office today for routine follow-up status post minimally invasive mitral valve repair on August 31, 2020 for mitral valve prolapse with stage C severe asymptomatic primary mitral regurgitation.  Her early postoperative recovery in the hospital was uneventful and she was discharged home on the fourth postoperative day.  Since hospital discharge she has returned to [place] where her prothrombin time has been checked and Coumadin dose monitored by her primary cardiologist, Dr. Clovis Pu.  She returns her office today and reports that overall she is doing very well.  She still has some soreness in her right lateral chest and right shoulder but this continues to improve.  She has not required any sort of pain relievers other than oral Tylenol.  She also states that she gets short of breath with exertion but her exercise tolerance is gradually improving.  She can now go up a flight of stairs without any difficulty.  She has not been trying to walk more than 5 to 10 minutes at a time.  Appetite is good.  She denies any shortness of breath.  She has not had any palpitations or dizzy spells.  She is scheduled to see Dr. Clovis Pu for routine follow-up next week.   Current Outpatient Medications  Medication Sig Dispense Refill  . aspirin EC 81 MG EC tablet Take 1 tablet (81 mg total) by mouth daily. Swallow whole. 30 tablet 11  . levonorgestrel-ethinyl estradiol (NORDETTE) 0.15-30 MG-MCG tablet Take 1 tablet by mouth daily.    . metoprolol tartrate (LOPRESSOR) 25 MG tablet Take 0.5 tablets (12.5 mg total) by mouth 2 (two) times daily. 30 tablet 2  . warfarin (COUMADIN) 5 MG tablet Take 1 tablet (5 mg  total) by mouth daily at 4 PM. Or as directed by the Coumadin Clinic. 30 tablet 2   No current facility-administered medications for this visit.      Physical Exam:   BP 125/80 (BP Location: Right Arm, Patient Position: Sitting)   Pulse 96   Temp 98.5 F (36.9 C)   Resp 20   Ht 5\' 7"  (1.702 m)   Wt 167 lb (75.8 kg)   SpO2 96% Comment: RA  BMI 26.16 kg/m   General:  Well-appearing  Chest:   Clear to auscultation with symmetrical breath sounds  CV:   Regular rate and rhythm without murmur  Incisions:  Healing nicely  Abdomen:  Soft nontender  Extremities:  Warm and well-perfused  Diagnostic Tests:  CHEST - 2 VIEW  COMPARISON:  09/04/2020  FINDINGS: Heart size is normal. Mitral postoperative change from mitral valve repair identified. Small left pleural effusion. Scar versus platelike atelectasis is again noted involving the right middle lobe. No interstitial edema or airspace consolidation.  IMPRESSION: 1. New small left pleural effusion 2. Scar versus platelike atelectasis within the right middle lobe.   Electronically Signed   By: 09/06/2020 M.D.   On: 09/18/2020 08:40    Impression:  Patient is doing very well just over 2 weeks status post minimally invasive mitral valve repair.   Plan:  We have not recommended any changes  to the patient's current medications.  We would typically plan to keep the patient on warfarin anticoagulation for a total of 3 months from the time of her surgery after which time it may be discontinued as long as she is not having any sort of atrial arrhythmias.  Similarly, she is taking low-dose metoprolol to prevent atrial arrhythmias and this possibly could be stopped at the same time that her warfarin is discontinued.  I have encouraged the patient to continue to gradually increase her physical activity as tolerated with her primary limitation remaining that she refrain from heavy lifting or strenuous use of her arms or  shoulders for at least another 6 to 8 weeks.  She may resume driving an automobile.  I have encouraged her to enroll and participate in her local cardiac rehab program.  I have also asked the patient to schedule routine follow-up echocardiogram with her local cardiologist in 4 to 6 weeks.  We will tentatively plan to have the patient return for routine follow-up in approximately 2 months.  Finally, the patient has been reminded regarding the importance of dental hygiene and the lifelong need for antibiotic prophylaxis for all dental cleanings and other related invasive procedures.      Salvatore Decent. Cornelius Moras, MD 09/18/2020 9:17 AM

## 2020-09-18 NOTE — Patient Instructions (Signed)
Continue all previous medications without any changes at this time   Schedule routine follow up echocardiogram with your cardiologist in 4-6 weeks  You may discuss with your cardiologist the possibility of stopping both warfarin (Coumadin) and metoprolol approximately three months after the time of your surgery.  As long as you are not having any irregular heart rhythms it would be reasonable to stop both at that time.    You may continue to gradually increase your physical activity as tolerated.  Refrain from any heavy lifting or strenuous use of your arms and shoulders until at least 8 weeks from the time of your surgery, and avoid activities that cause increased pain in your chest on the side of your surgical incision.  Otherwise you may continue to increase activities without any particular limitations.  Increase the intensity and duration of physical activity gradually.  You may return to driving an automobile as long as you are no longer requiring oral narcotic pain relievers during the daytime.  It would be wise to start driving only short distances during the daylight and gradually increase from there as you feel comfortable.  You are encouraged to enroll and participate in the outpatient cardiac rehab program beginning as soon as practical.  Endocarditis is a potentially serious infection of heart valves or inside lining of the heart.  It occurs more commonly in patients with diseased heart valves (such as patient's with aortic or mitral valve disease) and in patients who have undergone heart valve repair or replacement.  Certain surgical and dental procedures may put you at risk, such as dental cleaning, other dental procedures, or any surgery involving the respiratory, urinary, gastrointestinal tract, gallbladder or prostate gland.   To minimize your chances for develooping endocarditis, maintain good oral health and seek prompt medical attention for any infections involving the mouth,  teeth, gums, skin or urinary tract.    Always notify your doctor or dentist about your underlying heart valve condition before having any invasive procedures. You will need to take antibiotics before certain procedures, including all routine dental cleanings or other dental procedures.  Your cardiologist or dentist should prescribe these antibiotics for you to be taken ahead of time.

## 2020-10-09 ENCOUNTER — Telehealth (HOSPITAL_COMMUNITY): Payer: Self-pay | Admitting: Thoracic Surgery (Cardiothoracic Vascular Surgery)

## 2020-10-09 NOTE — Telephone Encounter (Signed)
Patient called and cancelled echocardiogram for 10/13/20 due to she lives in [place], [place] and will have with Cardiologist down there on 10/10/20. Order will be removed from the Echo Wq. Thank you.

## 2020-10-13 ENCOUNTER — Other Ambulatory Visit (HOSPITAL_COMMUNITY): Payer: Federal, State, Local not specified - PPO

## 2020-10-17 ENCOUNTER — Encounter: Payer: Self-pay | Admitting: Thoracic Surgery (Cardiothoracic Vascular Surgery)

## 2020-11-09 NOTE — Telephone Encounter (Signed)
-----   Message from Purcell Nails, MD sent at 11/08/2020  2:07 PM EDT ----- No her appointment may be cancelled   ----- Message ----- From: Oneal Grout Sent: 11/08/2020   9:34 AM EDT To: Purcell Nails, MD  Good Morning!  Called patient to reschedule her appointment due to schedule change for Monday. She wanted to know if appointment was really necessary, states she leaves in [facility] and is being followed by cardiology there. She is doing great, only question she would have is when coumadin should be stopped. Would you like to do a virtual with her?  Thanks

## 2020-11-09 NOTE — Telephone Encounter (Signed)
-----   Message from Clarence H Owen, MD sent at 11/08/2020  2:07 PM EDT ----- No her appointment may be cancelled   ----- Message ----- From: Trelon Plush S Sent: 11/08/2020   9:34 AM EDT To: Clarence H Owen, MD  Good Morning!  Called patient to reschedule her appointment due to schedule change for Monday. She wanted to know if appointment was really necessary, states she leaves in [place] and is being followed by cardiology there. She is doing great, only question she would have is when coumadin should be stopped. Would you like to do a virtual with her?  Thanks   

## 2020-11-13 ENCOUNTER — Encounter: Payer: Federal, State, Local not specified - PPO | Admitting: Thoracic Surgery (Cardiothoracic Vascular Surgery)

## 2020-11-30 ENCOUNTER — Other Ambulatory Visit: Payer: Self-pay | Admitting: Physician Assistant

## 2021-07-05 IMAGING — CR DG CHEST 2V
3 series · 3 of 3 positions shown · non-contrast
Comparison: CT 07/05/2020

CLINICAL DATA: Preop

EXAM:
CHEST - 2 VIEW

[w chest pa]
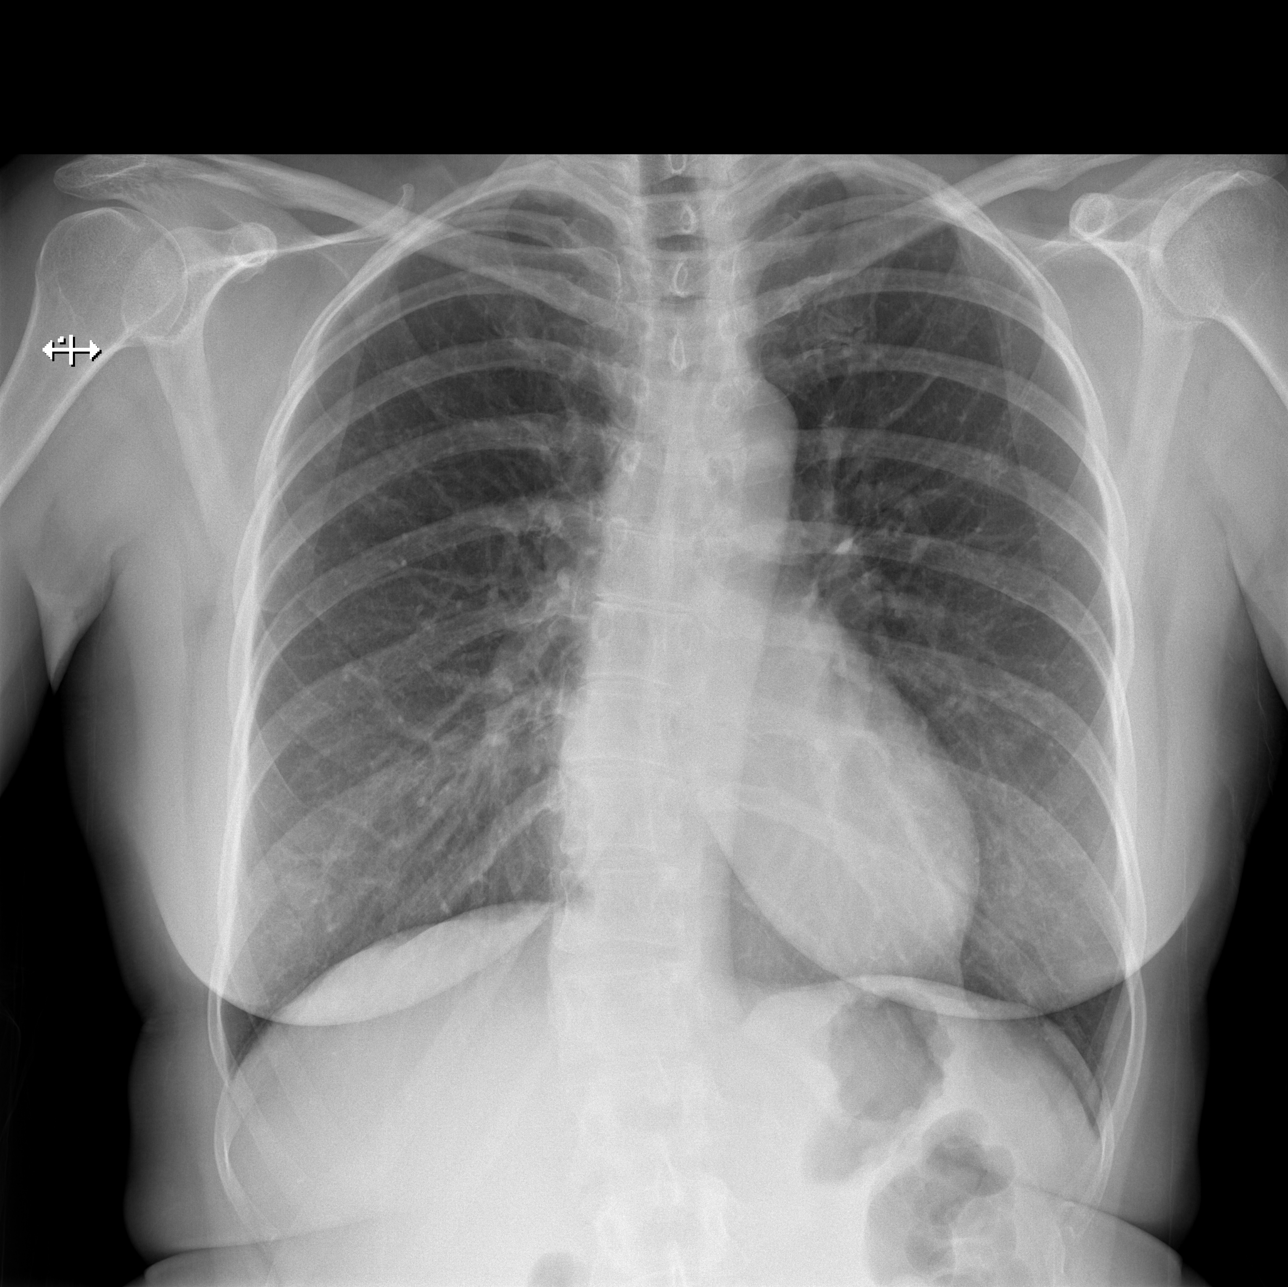

[w chest lat (1 of 2)]
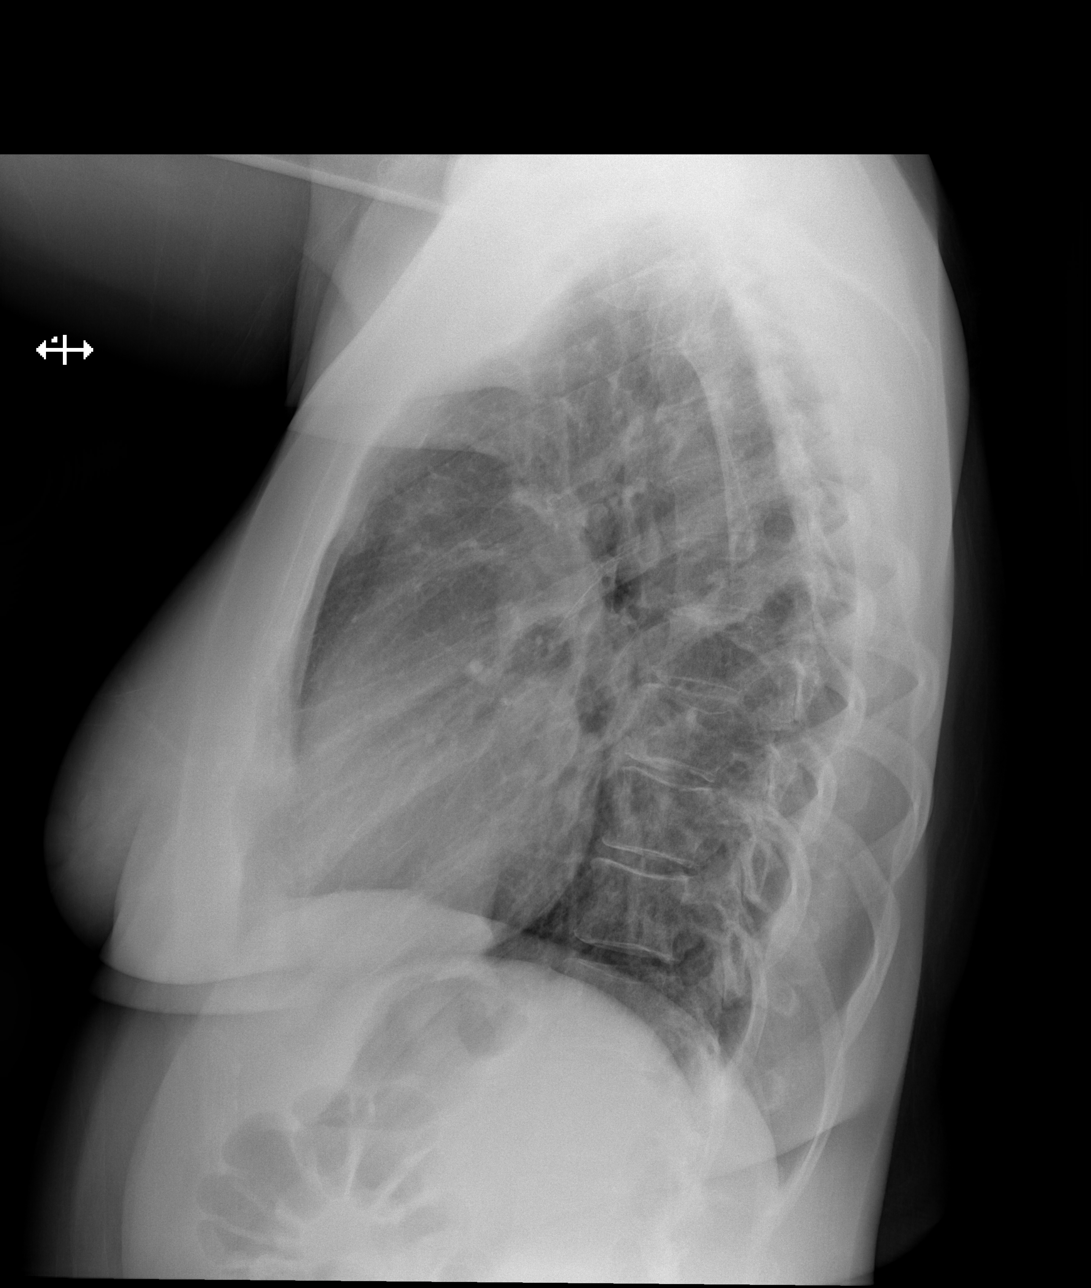

[w chest lat (2 of 2)]
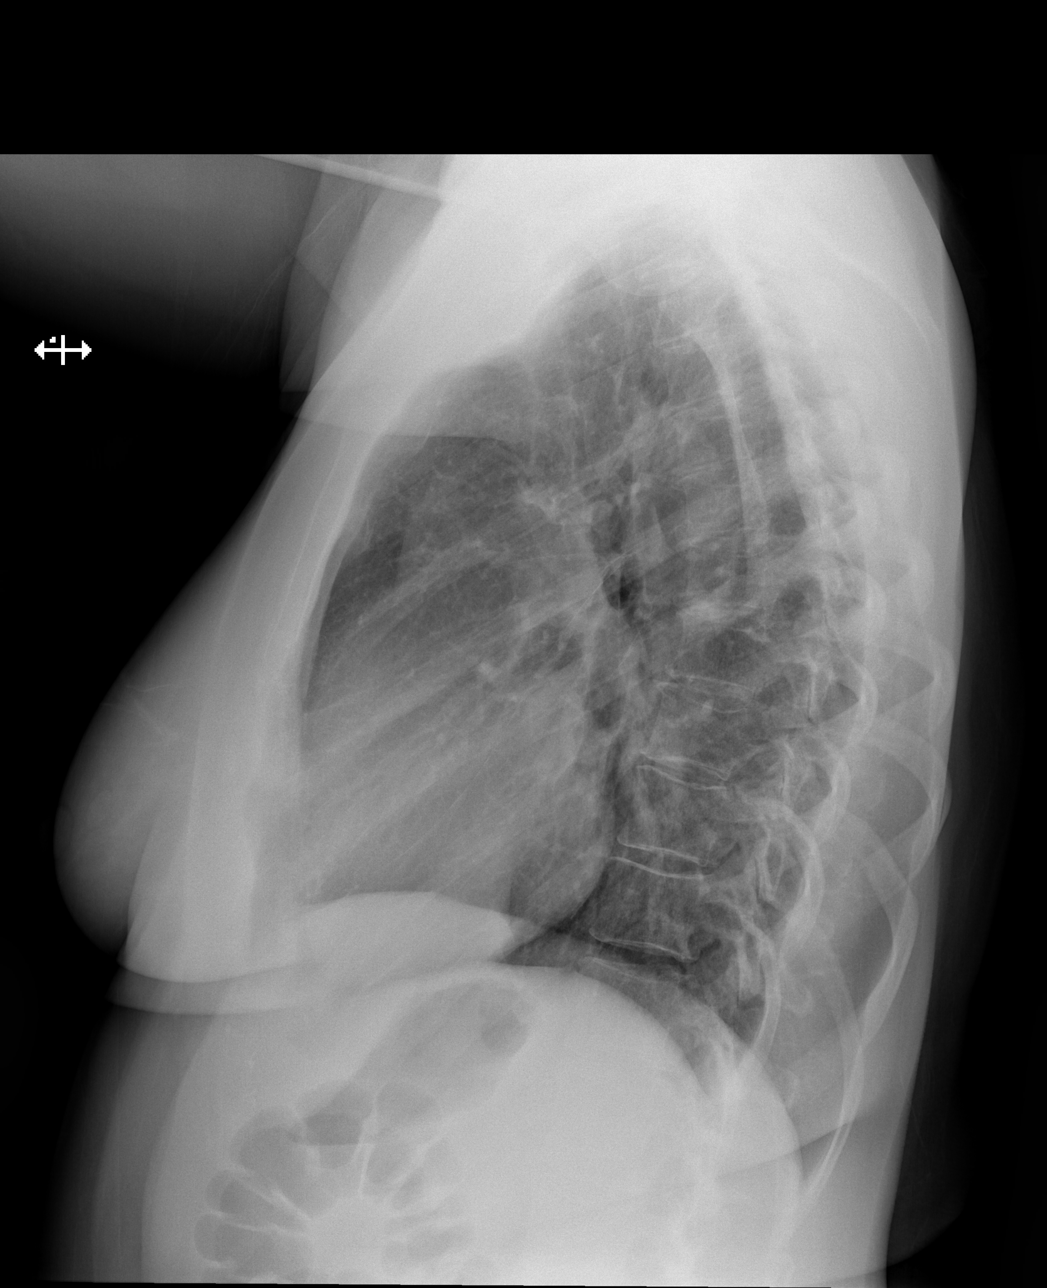

[3 of 3 positions shown; findings below may reference images not displayed]

FINDINGS: Heart and mediastinal contours are within normal limits. No focal
opacities or effusions. No acute bony abnormality.
IMPRESSION: No active cardiopulmonary disease.
